# Patient Record
Sex: Female | Born: 1960 | Race: White | Hispanic: No | Smoking: Current every day smoker
Health system: Southern US, Community
[De-identification: ages and names within clinical notes are randomized; demographics above are authoritative.]

## PROBLEM LIST (undated history)

## (undated) DIAGNOSIS — R519 Headache, unspecified: Secondary | ICD-10-CM

## (undated) DIAGNOSIS — J449 Chronic obstructive pulmonary disease, unspecified: Secondary | ICD-10-CM

## (undated) DIAGNOSIS — F329 Major depressive disorder, single episode, unspecified: Secondary | ICD-10-CM

## (undated) DIAGNOSIS — F22 Delusional disorders: Secondary | ICD-10-CM

## (undated) DIAGNOSIS — J439 Emphysema, unspecified: Secondary | ICD-10-CM

## (undated) DIAGNOSIS — D649 Anemia, unspecified: Secondary | ICD-10-CM

## (undated) DIAGNOSIS — F419 Anxiety disorder, unspecified: Secondary | ICD-10-CM

## (undated) DIAGNOSIS — T50902A Poisoning by unspecified drugs, medicaments and biological substances, intentional self-harm, initial encounter: Secondary | ICD-10-CM

## (undated) DIAGNOSIS — F32A Depression, unspecified: Secondary | ICD-10-CM

## (undated) DIAGNOSIS — S0990XA Unspecified injury of head, initial encounter: Secondary | ICD-10-CM

## (undated) DIAGNOSIS — K219 Gastro-esophageal reflux disease without esophagitis: Secondary | ICD-10-CM

## (undated) DIAGNOSIS — M359 Systemic involvement of connective tissue, unspecified: Secondary | ICD-10-CM

## (undated) DIAGNOSIS — J45909 Unspecified asthma, uncomplicated: Secondary | ICD-10-CM

## (undated) DIAGNOSIS — J302 Other seasonal allergic rhinitis: Secondary | ICD-10-CM

## (undated) DIAGNOSIS — R51 Headache: Secondary | ICD-10-CM

## (undated) DIAGNOSIS — G4730 Sleep apnea, unspecified: Secondary | ICD-10-CM

## (undated) DIAGNOSIS — R197 Diarrhea, unspecified: Secondary | ICD-10-CM

## (undated) DIAGNOSIS — T7840XA Allergy, unspecified, initial encounter: Secondary | ICD-10-CM

## (undated) DIAGNOSIS — R7303 Prediabetes: Secondary | ICD-10-CM

## (undated) DIAGNOSIS — F99 Mental disorder, not otherwise specified: Secondary | ICD-10-CM

## (undated) DIAGNOSIS — M1990 Unspecified osteoarthritis, unspecified site: Secondary | ICD-10-CM

## (undated) HISTORY — DX: Allergy, unspecified, initial encounter: T78.40XA

## (undated) HISTORY — PX: TONSILLECTOMY: SUR1361

## (undated) HISTORY — PX: NASAL SEPTUM SURGERY: SHX37

## (undated) HISTORY — DX: Emphysema, unspecified: J43.9

---

## 2012-08-03 DIAGNOSIS — K219 Gastro-esophageal reflux disease without esophagitis: Secondary | ICD-10-CM | POA: Insufficient documentation

## 2012-08-03 DIAGNOSIS — J45909 Unspecified asthma, uncomplicated: Secondary | ICD-10-CM | POA: Insufficient documentation

## 2012-08-03 DIAGNOSIS — F603 Borderline personality disorder: Secondary | ICD-10-CM | POA: Insufficient documentation

## 2012-08-03 DIAGNOSIS — G4700 Insomnia, unspecified: Secondary | ICD-10-CM | POA: Insufficient documentation

## 2012-08-03 DIAGNOSIS — J449 Chronic obstructive pulmonary disease, unspecified: Secondary | ICD-10-CM | POA: Insufficient documentation

## 2012-08-03 DIAGNOSIS — F319 Bipolar disorder, unspecified: Secondary | ICD-10-CM | POA: Insufficient documentation

## 2012-08-03 DIAGNOSIS — J309 Allergic rhinitis, unspecified: Secondary | ICD-10-CM | POA: Insufficient documentation

## 2012-08-03 DIAGNOSIS — Z720 Tobacco use: Secondary | ICD-10-CM | POA: Insufficient documentation

## 2013-03-23 DIAGNOSIS — R87619 Unspecified abnormal cytological findings in specimens from cervix uteri: Secondary | ICD-10-CM | POA: Insufficient documentation

## 2015-05-31 ENCOUNTER — Ambulatory Visit
Admission: EM | Admit: 2015-05-31 | Discharge: 2015-05-31 | Disposition: A | Discharge status: Home / Self Care | Payer: Medicare Other | Attending: Family Medicine | Admitting: Family Medicine

## 2015-05-31 DIAGNOSIS — R51 Headache: Secondary | ICD-10-CM | POA: Diagnosis not present

## 2015-05-31 DIAGNOSIS — J449 Chronic obstructive pulmonary disease, unspecified: Secondary | ICD-10-CM | POA: Diagnosis not present

## 2015-05-31 DIAGNOSIS — Z9889 Other specified postprocedural states: Secondary | ICD-10-CM | POA: Diagnosis not present

## 2015-05-31 DIAGNOSIS — J3089 Other allergic rhinitis: Secondary | ICD-10-CM | POA: Diagnosis not present

## 2015-05-31 DIAGNOSIS — R519 Headache, unspecified: Secondary | ICD-10-CM

## 2015-05-31 DIAGNOSIS — F17210 Nicotine dependence, cigarettes, uncomplicated: Secondary | ICD-10-CM | POA: Insufficient documentation

## 2015-05-31 DIAGNOSIS — K219 Gastro-esophageal reflux disease without esophagitis: Secondary | ICD-10-CM

## 2015-05-31 DIAGNOSIS — IMO0001 Reserved for inherently not codable concepts without codable children: Secondary | ICD-10-CM

## 2015-05-31 DIAGNOSIS — F329 Major depressive disorder, single episode, unspecified: Secondary | ICD-10-CM | POA: Diagnosis not present

## 2015-05-31 DIAGNOSIS — R1013 Epigastric pain: Secondary | ICD-10-CM | POA: Diagnosis not present

## 2015-05-31 DIAGNOSIS — J45909 Unspecified asthma, uncomplicated: Secondary | ICD-10-CM | POA: Insufficient documentation

## 2015-05-31 DIAGNOSIS — Z79899 Other long term (current) drug therapy: Secondary | ICD-10-CM | POA: Diagnosis not present

## 2015-05-31 DIAGNOSIS — R197 Diarrhea, unspecified: Secondary | ICD-10-CM | POA: Diagnosis not present

## 2015-05-31 HISTORY — DX: Headache, unspecified: R51.9

## 2015-05-31 HISTORY — DX: Gastro-esophageal reflux disease without esophagitis: K21.9

## 2015-05-31 HISTORY — DX: Mental disorder, not otherwise specified: F99

## 2015-05-31 HISTORY — DX: Unspecified asthma, uncomplicated: J45.909

## 2015-05-31 HISTORY — DX: Headache: R51

## 2015-05-31 HISTORY — DX: Other seasonal allergic rhinitis: J30.2

## 2015-05-31 HISTORY — DX: Unspecified injury of head, initial encounter: S09.90XA

## 2015-05-31 HISTORY — DX: Chronic obstructive pulmonary disease, unspecified: J44.9

## 2015-05-31 HISTORY — DX: Major depressive disorder, single episode, unspecified: F32.9

## 2015-05-31 HISTORY — DX: Depression, unspecified: F32.A

## 2015-05-31 LAB — BASIC METABOLIC PANEL
Anion gap: 7 (ref 5–15)
BUN: 10 mg/dL (ref 6–20)
CHLORIDE: 105 mmol/L (ref 101–111)
CO2: 27 mmol/L (ref 22–32)
CREATININE: 0.82 mg/dL (ref 0.44–1.00)
Calcium: 8.9 mg/dL (ref 8.9–10.3)
GFR calc Af Amer: >60 mL/min (ref >60)
GFR calc non Af Amer: >60 mL/min (ref >60)
GLUCOSE: 93 mg/dL (ref 65–99)
Potassium: 4.2 mmol/L (ref 3.5–5.1)
Sodium: 139 mmol/L (ref 135–145)

## 2015-05-31 LAB — CBC WITH DIFFERENTIAL/PLATELET
Basophils Absolute: 0.1 10*3/uL (ref 0–0.1)
Basophils Relative: 1 %
Eosinophils Absolute: 0.3 10*3/uL (ref 0–0.7)
Eosinophils Relative: 3 %
HEMATOCRIT: 41 % (ref 35.0–47.0)
HEMOGLOBIN: 13.7 g/dL (ref 12.0–16.0)
LYMPHS ABS: 3.9 10*3/uL — AB (ref 1.0–3.6)
Lymphocytes Relative: 39 %
MCH: 29.9 pg (ref 26.0–34.0)
MCHC: 33.4 g/dL (ref 32.0–36.0)
MCV: 89.4 fL (ref 80.0–100.0)
MONO ABS: 0.6 10*3/uL (ref 0.2–0.9)
MONOS PCT: 6 %
NEUTROS ABS: 5.1 10*3/uL (ref 1.4–6.5)
NEUTROS PCT: 51 %
Platelets: 231 10*3/uL (ref 150–440)
RBC: 4.58 MIL/uL (ref 3.80–5.20)
RDW: 14 % (ref 11.5–14.5)
WBC: 10 10*3/uL (ref 3.6–11.0)

## 2015-05-31 MED ORDER — ALBUTEROL SULFATE HFA 108 (90 BASE) MCG/ACT IN AERS: 2.0000 | INHALATION_SPRAY | Freq: Four times a day (QID) | RESPIRATORY_TRACT | Status: AC | PRN

## 2015-05-31 MED ORDER — CETIRIZINE HCL 10 MG PO TABS: 10.0000 mg | ORAL_TABLET | Freq: Every day | ORAL | Status: DC

## 2015-05-31 MED ORDER — OMEPRAZOLE 20 MG PO CPDR: DELAYED_RELEASE_CAPSULE | ORAL | Status: AC

## 2015-05-31 NOTE — ED Provider Notes (Signed)
CSN: [id number]     Arrival date & time 05/31/15  1124 History   First MD Initiated Contact with Patient 05/31/15 1216     Chief Complaint  Patient presents with  . Diarrhea    Pt with multiple long-term medical complaints and no PCP. 2 months of Headaches, 6 weeks of Diarrhea, and pain to left lower eyelid. Pt out of some of her medications.    (Consider location/radiation/quality/duration/timing/severity/associated sxs/prior Treatment) HPI: Patient presents today stating that she would like to establish with a primary care physician. She states that her neuropsychiatrist as stated that she needs to establish with one to handle her chronic medical issues. She admits to having diarrhea for approximately 6 weeks. She denies any bloody diarrhea or recent travel. She denies any recent antibiotic use. She has mild epigastric pain at times that she relates to her acid reflux. She also has had mild intermittent generalized headaches for 2 months. No headache currently. She denies any weight loss, chest pain, shortness of breath, syncope, vision problems, vomiting, urinary symptoms, fever. She also asked that she get refills on her Zyrtec, Prilosec, Albuterol.  Past Medical History  Diagnosis Date  . Head injury   . Psychiatric disorder   . Frequent headaches   . Acid reflux   . Depression   . Seasonal allergic rhinitis   . Asthma   . COPD (chronic obstructive pulmonary disease) [facility])    Past Surgical History  Procedure Laterality Date  . Nasal septum surgery    . Tonsillectomy     History reviewed. No pertinent family history. Social History  Substance Use Topics  . Smoking status: Current Every Day Smoker -- 1.00 packs/day    Types: Cigarettes  . Smokeless tobacco: None  . Alcohol Use: No   OB History    No data available     Review of Systems: Negative except mentioned above.   Allergies  Seroquel  Home Medications   Prior to Admission medications   Medication Sig Start  Date End Date Taking? Authorizing Provider  albuterol (PROVENTIL HFA;VENTOLIN HFA) 108 (90 Base) MCG/ACT inhaler Inhale into the lungs every 6 (six) hours as needed for wheezing or shortness of breath.   Yes Historical Provider, MD  cetirizine (ZYRTEC) 10 MG tablet Take 10 mg by mouth daily.   Yes Historical Provider, MD  diazepam (VALIUM) 10 MG tablet Take 10 mg by mouth every 6 (six) hours as needed for anxiety (Take 3 pills at a time total of 30mg ).   Yes Historical Provider, MD  OLANZapine (ZYPREXA) 5 MG tablet Take 5 mg by mouth at bedtime.   Yes Historical Provider, MD  omeprazole (PRILOSEC) 40 MG capsule Take 40 mg by mouth daily.   Yes Historical Provider, MD   Meds Ordered and Administered this Visit  Medications - No data to display  BP 135/68 mmHg  Pulse 57  Temp(Src) 98.3 F (36.8 C) (Oral)  Resp 18  Ht 5\' 6"  (1.676 m)  Wt 132 lb (59.875 kg)  BMI 21.32 kg/m2  SpO2 100% No data found.   Physical Exam   GENERAL: NAD HEENT: no pharyngeal erythema, no exudate, no erythema of TMs, no cervical LAD RESP: CTA B CARD: RRR ABD: +BS, NT/ND, no rebound or guarding  NEURO: CN II-XII grossly intact   ED Course  Procedures (including critical care time)  Labs Review Labs Reviewed  CBC WITH DIFFERENTIAL/PLATELET  BASIC METABOLIC PANEL    Imaging Review No results found.  MDM  A/P: Multiple complaints including diarrhea and headaches- encourage patient to establish care with a primary care physician to further evaluate and treat. Given her symptoms I would recommend that she have stool studies done. I did do a CBC and BMP in the office today. Her vitals are stable and she can be discharged at this point establish care with a primary care physician in the near future. I have given her refills on her Prilosec, Zyrtec and Proventil for a few weeks. Patient will seek immediate medical attention if her symptoms do worsen as discussed.    Paulina Fusi, MD 05/31/15 1324

## 2015-06-04 ENCOUNTER — Ambulatory Visit: Payer: Self-pay | Admitting: Family Medicine

## 2015-06-07 ENCOUNTER — Encounter: Payer: Self-pay | Admitting: Gynecology

## 2015-06-07 ENCOUNTER — Ambulatory Visit
Admission: EM | Admit: 2015-06-07 | Discharge: 2015-06-07 | Disposition: A | Discharge status: Home / Self Care | Payer: Medicare Other | Attending: Family Medicine | Admitting: Family Medicine

## 2015-06-07 DIAGNOSIS — K0889 Other specified disorders of teeth and supporting structures: Secondary | ICD-10-CM | POA: Diagnosis not present

## 2015-06-07 DIAGNOSIS — K0500 Acute gingivitis, plaque induced: Secondary | ICD-10-CM

## 2015-06-07 MED ORDER — KETOROLAC TROMETHAMINE 60 MG/2ML IM SOLN
60.0000 mg | Freq: Once | INTRAMUSCULAR | Status: AC
  Administered 2015-06-07: 60 mg via INTRAMUSCULAR

## 2015-06-07 MED ORDER — AMOXICILLIN-POT CLAVULANATE 875-125 MG PO TABS: 1.0000 | ORAL_TABLET | Freq: Two times a day (BID) | ORAL | Status: DC

## 2015-06-07 MED ORDER — MELOXICAM 15 MG PO TABS: 15.0000 mg | ORAL_TABLET | Freq: Every day | ORAL | Status: DC

## 2015-06-07 NOTE — ED Notes (Signed)
Per patient stated tooth ache x 4 days ago at the right lower jaw. Per patient the front of her tooth start hurting x yesterday.  Per patient stated that she started a new medication on 05/24/15 Rx OlanZapine 5mg , 1 tab at night. Per patient the side effect of the medication is tooth pain.

## 2015-06-07 NOTE — Discharge Instructions (Signed)
Gingivitis  Gingivitis is an infection of the teeth and bones that support the teeth. Your gums become red, sore, and puffy (swollen). It is caused by germs that build up on your teeth and gums (plaque). HOME CARE  Floss and then brush your teeth.  Brush at least twice a day.  Floss at least once a day.  Avoid sugar between meals.  Do not drink juice before bed. Only drink water.  Make and keep your regular checkups and cleanings with your dentist.  Use any mouth care product or toothpaste as told by your dentist. GET HELP RIGHT AWAY IF:  You have painful, red tissue around your teeth.  You have trouble chewing.  You have loose or infected teeth. MAKE SURE YOU:  Understand these instructions.  Will watch your condition.  Will get help right away if you are not doing well or get worse.   This information is not intended to replace advice given to you by your health care provider. Make sure you discuss any questions you have with your health care provider.   Document Released: 03/06/2010 Document Revised: 04/26/2011 Document Reviewed: 09/16/2014 Elsevier Interactive Patient Education 2016 [place], Adult Preventive dental care includes seeing a dentist regularly and practicing good dental care (oral hygiene) at home. These actions can help to prevent cavities and other tooth problems, root canal problems, gum disease (gingivitis), and tooth loss. Regular dental exams may also help your health care provider to diagnose other medical problems. Many diseases, including mouth cancers, have early signs that can be found during a preventive dental care visit. WHAT CAN I EXPECT DURING MY DENTAL VISITS? Many adults see their dentist one or two times each year for oral exams and cleanings. Talk with your dentist about the best preventive dental care schedule for you. At your visit, your dentist may ask you about:  Your overall health and diet.  Any new  symptoms, such as:  Bleeding gums.  Mouth, tooth, or jaw pain. Your dentist will do a mouth (oral) exam to check for:  Cavities.  Gingivitis or other problems.  Signs of cancer.  Neck swelling or lumps.  Abnormal jaw movement or pain in the jaw joint. You may also have:  Dental X-rays.  Your teeth cleaned. If you have an early problem, like a cavity, your dentist will schedule time for you to get treatment. If you have a tooth root problem, gum disease, or a sign of another disease, your dentist may send you to see another health care provider for care. HOW CAN I CARE FOR MY TEETH AT HOME?  Brush with an approved fluoride toothpaste every morning and night. If possible, brush within 10 minutes after every meal.  Floss one time every day.  Periodically check your teeth for white or brown spots after brushing. These may be signs of cavities.  Check your gums for swelling or bleeding. These may be signs of gum disease, such as gingivitis or periodontitis.  Make sure your diet includes plenty of fruits, vegetables, milk and dairy products, whole grains, and proteins. Do not eat a lot of starchy foods or foods with added sugar. Talk with your health care provider if you have questions about following a healthy diet.  Avoid sodas, sugary snacks, and sticky candies.  Do not smoke.  Do not get mouth piercings.  If you have tooth or gum pain, gargle with a salt-water mixture 3-4 times per day or as needed. To make a  salt-water mixture, completely dissolve -1 tsp of salt in 1 cup of warm water.  Take over-the counter and prescription medicines only as told by your dentist.  If you have a permanent tooth knocked out:  Find the tooth.  Pick it up by the top (crown) with a tissue or gauze.  Wash the tooth for no more than 10 seconds under cold, running water.  Try to put the tooth back into the gum socket.  Put the tooth in a glass of milk if you cannot get it back in  place.  Go to your dentist right away. Take the tooth with you. WHEN SHOULD I SEEK MEDICAL CARE? Call your dentist if you have:  Gum, tooth, or jaw pain.  Red, swollen, or bleeding gums.  A tooth or teeth that are very sensitive to hot or cold.  Very bad breath.  A problem with a filling, crown, implant, or denture.  A broken or loose tooth.  A growth or sore in your mouth that is not going away. FOR MORE INFORMATION American Dental Association: http://clayton-rivera.info/    This information is not intended to replace advice given to you by your health care provider. Make sure you discuss any questions you have with your health care provider.   Document Released: 10/23/2014 Document Reviewed: 07/16/2014 Elsevier Interactive Patient Education 2016 [place] [address] is a great source of information about community services available.  Access by dialing 2-1-1 from anywhere in [place], or by website -  CustodianSupply.fi.   Other Local Resources (Updated 02/2015)  Dental  Care   Services    Phone Number and Address  Cost  [facility] For children 68 - 44 years of age:   Cleaning  Tooth brushing/flossing instruction  Sealants, fillings, crowns  Extractions  Emergency treatment  [phone] [address], [place] [address] Charges based on family income.  Medicaid and some insurance plans accepted.     [facility] - [facility], fillings, crowns  Extractions  Emergency treatment [phone], [place]  Pregnant women 64 years of age or older with a Medicaid card  [facility] - [place]  Cleaning  Sealants, fillings, crowns  Extractions  Emergency treatment [phone] [address] [place], [place] Pregnant women 61 years of age or older with a Medicaid card   [facility] For children 46 - 64 years of age:   Cleaning  Tooth brushing/flossing instruction  Sealants, fillings, crowns  Extractions  Emergency treatment Limited orthodontic services for patients with Medicaid [phone] [address],  [address] Medicaid and [facility] Health Choice cover for children up to age 11 and pregnant women.  Parents of children up to age 34 without Medicaid pay a reduced fee at time of service.  [place] For children 36 - 82 years of age:   Cleaning  Tooth brushing/flossing instruction  Sealants, fillings, crowns  Extractions  Emergency treatment Limited orthodontic services for patients with Medicaid [phone] [place], [place].  Medicaid and  Health Choice cover for children up to age 11 and pregnant women.  Parents of children up to age 72 without Medicaid pay a reduced fee.  [facility] [employer], fillings, crowns  Extractions  Hours: Tuesdays and Thursdays, 4:15 - 8 pm [phone] [address], [place], [place] [address] Services free of charge to [facility] residents ages 18-64 who do not have health insurance, Medicare, [place], or [place] benefits and fall within federal poverty guidelines  [place] care in addition to primary medical care, nutritional counseling, and pharmacy:  Engineer, drilling, fillings, crowns  Extractions                  [phone] [facility], [place], [place], [place], [place], [facility], [place], [place] [facility], [place], [place], most insurance.  Also provides services available to all with fees adjusted based on ability to pay.    [facility]  Cleaning  Tooth brushing/flossing instruction  Sealants, fillings, crowns  Extractions  Emergency treatment Hours: Tuesdays, Thursdays, and Fridays from 8 am to 5 pm by appointment only. [phone] [place], [place] [address] [facility] residents with Medicaid (depending on eligibility) and children with [facility] Health Choice - call for more information.  [facility]  Extractions only  Hours: 2nd and 4th Thursday of each month from 6:30 am - 9 am.   [phone] ext. [address] [place], [place] [address] Ages 22 and older only.  Patients are seen on a first come, first served basis.  [employer]  [employer]  Extractions  Orthodontics  Endodontics  Implants/Crowns/Bridges  Complete and partial dentures [phone] [place], [place] Patients must complete an application for services.  There is often a waiting list.

## 2015-06-07 NOTE — ED Provider Notes (Signed)
CSN: [id number]     Arrival date & time 06/07/15  [id number] History   First MD Initiated Contact with Patient 06/07/15 [phone]    Nurses notes were reviewed. Chief Complaint  Patient presents with  . Dental Pain  Patient is an unusual history 55 year old white female who informs me when I walked the room that she has mental health issues and challenges. She states that she is having pain in the right lower gum for several days now. She's been taking Motrin she can take Aleve but she can't take Naprosyn. She's not had good relief of pain but since 2:00 this morning she's been up most the morning complaining of pain in the lower mouth as well. The pain is now spread from the right lower him to the mid lower mouth as well. She has a very unusual and she difficulty history of taking care of his significant other with her Algis Downs brain and orthopedic problems who has subsequently gone back to his home in [place]. She does establish itself was establishing herself with Dr. Vicente Masson as she seeing a neuropsychiatrist as well. She is currently taking Zyprexa which she states is been wonderful in controlling her mood swings, anger and frustration. She saw where Zyprexa could cause hent pain and she was worried about that. She does admit to having a history of face planting about a year ago when she was taking care of her significant other requiring dental surgery but because of all the other things occurring in her life she opted not to do that so she has several teeth are loose and she does smoke at least a pack cigarettes a day. She does states she needs to see a dentist explained to her with her need to see a dentist and other dental issues I don't think this apraxia is the cause of her problems at this time.  Multiple medical problems mostly psychiatric as well as head injury history and frequent headaches asthma and COPD. 7 nasal septum surgery and tonsillectomy. No significant family history pertinent to today's visit. She  denies any fever.    (Consider location/radiation/quality/duration/timing/severity/associated sxs/prior Treatment) Patient is a 55 y.o. female presenting with tooth pain. The history is provided by the patient. No language interpreter was used.  Dental Pain Location:  Lower Quality:  Aching and pressure-like Severity:  Moderate Onset quality:  Gradual Progression:  Worsening Chronicity:  New Context: dental caries, poor dentition and trauma   Relieved by:  Nothing Ineffective treatments:  NSAIDs Associated symptoms: facial pain and gum swelling   Risk factors: lack of dental care, periodontal disease and smoking     Past Medical History  Diagnosis Date  . Head injury   . Psychiatric disorder   . Frequent headaches   . Acid reflux   . Depression   . Seasonal allergic rhinitis   . Asthma   . COPD (chronic obstructive pulmonary disease) [facility])    Past Surgical History  Procedure Laterality Date  . Nasal septum surgery    . Tonsillectomy     No family history on file. Social History  Substance Use Topics  . Smoking status: Current Every Day Smoker -- 1.00 packs/day    Types: Cigarettes  . Smokeless tobacco: None  . Alcohol Use: No   OB History    No data available     Review of Systems  Psychiatric/Behavioral: Positive for sleep disturbance and dysphoric mood. The patient is nervous/anxious.     Allergies  Seroquel  Home Medications  Prior to Admission medications   Medication Sig Start Date End Date Taking? Authorizing Provider  albuterol (PROVENTIL HFA;VENTOLIN HFA) 108 (90 Base) MCG/ACT inhaler Inhale 2 puffs into the lungs every 6 (six) hours as needed for wheezing or shortness of breath. 05/31/15  Yes Paulina Fusi, MD  cetirizine (ZYRTEC) 10 MG tablet Take 1 tablet (10 mg total) by mouth daily. 05/31/15  Yes Paulina Fusi, MD  diazepam (VALIUM) 10 MG tablet Take 10 mg by mouth every 6 (six) hours as needed for anxiety (Take 3 pills at a time total of  30mg ).   Yes Historical Provider, MD  OLANZapine (ZYPREXA) 5 MG tablet Take 5 mg by mouth at bedtime.   Yes Historical Provider, MD  omeprazole (PRILOSEC) 20 MG capsule Take one capsule once daily. 05/31/15  Yes Paulina Fusi, MD  amoxicillin-clavulanate (AUGMENTIN) 875-125 MG tablet Take 1 tablet by mouth 2 (two) times daily. 06/07/15   Frederich Cha, MD  meloxicam (MOBIC) 15 MG tablet Take 1 tablet (15 mg total) by mouth daily. 06/07/15   Frederich Cha, MD   Meds Ordered and Administered this Visit   Medications  ketorolac (TORADOL) injection 60 mg (not administered)    BP 122/60 mmHg  Pulse 72  Temp(Src) 98.3 F (36.8 C) (Oral)  Resp 16  Ht 5\' 6"  (1.676 m)  Wt 135 lb (61.236 kg)  BMI 21.80 kg/m2  SpO2 99% No data found.   Physical Exam  Constitutional: She is oriented to person, place, and time. She appears well-developed and well-nourished.  HENT:  Head: Normocephalic and atraumatic.  Mouth/Throat: Uvula is midline. Dental caries present. No uvula swelling.  Gingiva tenderness and swelling present  Eyes: Pupils are equal, round, and reactive to light.  Neck: Normal range of motion.  Musculoskeletal: Normal range of motion.  Neurological: She is alert and oriented to person, place, and time.  Vitals reviewed.   ED Course  Procedures (including critical care time)  Labs Review Labs Reviewed - No data to display  Imaging Review No results found.   Visual Acuity Review  Right Eye Distance:   Left Eye Distance:   Bilateral Distance:    Right Eye Near:   Left Eye Near:    Bilateral Near:         MDM   1. Gingivitis, acute   2. Pain, dental    [place] 60 mg Toradol IM for pain. Placed on Mobic 15 mg Augmentin 875 one tablet twice a day information of local dentistry and dental clinics given to patient as well as scripts sent to the drugstore of her choice. Follow-up with dentist next week.    Frederich Cha, MD 06/07/15 1036

## 2015-06-12 ENCOUNTER — Encounter: Payer: Self-pay | Admitting: Family Medicine

## 2015-06-12 ENCOUNTER — Ambulatory Visit (INDEPENDENT_AMBULATORY_CARE_PROVIDER_SITE_OTHER): Payer: Medicare Other | Admitting: Family Medicine

## 2015-06-12 VITALS — BP 130/100 | HR 72 | Ht 66.0 in | Wt 147.0 lb

## 2015-06-12 DIAGNOSIS — R339 Retention of urine, unspecified: Secondary | ICD-10-CM | POA: Diagnosis not present

## 2015-06-12 DIAGNOSIS — K047 Periapical abscess without sinus: Secondary | ICD-10-CM | POA: Diagnosis not present

## 2015-06-12 DIAGNOSIS — J309 Allergic rhinitis, unspecified: Secondary | ICD-10-CM

## 2015-06-12 DIAGNOSIS — F3170 Bipolar disorder, currently in remission, most recent episode unspecified: Secondary | ICD-10-CM | POA: Diagnosis not present

## 2015-06-12 DIAGNOSIS — Z720 Tobacco use: Secondary | ICD-10-CM | POA: Diagnosis not present

## 2015-06-12 DIAGNOSIS — K589 Irritable bowel syndrome without diarrhea: Secondary | ICD-10-CM

## 2015-06-12 DIAGNOSIS — J449 Chronic obstructive pulmonary disease, unspecified: Secondary | ICD-10-CM

## 2015-06-12 DIAGNOSIS — F17200 Nicotine dependence, unspecified, uncomplicated: Secondary | ICD-10-CM | POA: Insufficient documentation

## 2015-06-12 MED ORDER — CETIRIZINE HCL 10 MG PO TABS
10.0000 mg | ORAL_TABLET | Freq: Every day | ORAL | Status: AC
Start: 2015-06-12 — End: ?

## 2015-06-12 MED ORDER — LOPERAMIDE HCL 2 MG PO TABS: ORAL_TABLET | ORAL | Status: DC

## 2015-06-12 MED ORDER — TAMSULOSIN HCL 0.4 MG PO CAPS: 0.4000 mg | ORAL_CAPSULE | Freq: Every day | ORAL | Status: DC

## 2015-06-12 MED ORDER — TIOTROPIUM BROMIDE MONOHYDRATE 18 MCG IN CAPS: 18.0000 ug | ORAL_CAPSULE | Freq: Every day | RESPIRATORY_TRACT | Status: AC

## 2015-06-12 NOTE — Progress Notes (Signed)
Date:  06/12/2015   Name:  Edis Drach   DOB:  1961/02/09   MRN:  [id number]  PCP:  Adline Potter, MD    Chief Complaint: Establish Care and COPD   History of Present Illness:  This is a 55 y.o. female seen for initial visit, has been getting her primary care at [facility]. Hx bipolar d/o and borderline PD followed by psych on Zyprexa and Valium, which she takes prn. C/o SOB, dx'd with COPD in past, using albuterol inhaler multiple times a day every day and not helping anymore, does not want to talk about smoking cessation given the stress she's under. Never used other inhalers and won't take steroids because they make her mean. C/o chronic diarrhea x 6 years, was seen for this at [facility] two weeks ago, BMP/CBC normal. Stools occ black but no BRB. Has not tried any antidiarrheal including Imodium. Also seen [place] last week for dental infection and placed on Augmentin and Mobic, sxs improved, has dental appt next week. Needs refill cetrizine which she must take for her allergies. C/o urinary retention (has to push on bladder to empty) and stress incontinence when coughs or sneezes, wears Depends regularly. Had urology eval done at [facility] 20 yrs ago and told had a bladder like a man. One medicine she tried made her not urinate at all.   Review of Systems:  Review of Systems  Constitutional: Negative for fever and fatigue.  Respiratory: Negative for cough.   Cardiovascular: Negative for chest pain and leg swelling.  Gastrointestinal: Negative for nausea, vomiting and blood in stool.  Genitourinary: Negative for hematuria.  Neurological: Negative for light-headedness.    Patient Active Problem List   Diagnosis Date Noted  . IBS (irritable bowel syndrome) 06/12/2015  . Urinary retention 06/12/2015  . Smoker 06/12/2015  . Abnormal Pap smear of cervix 03/23/2013  . Allergic rhinitis 08/03/2012  . Airway hyperreactivity 08/03/2012  . Bipolar affective disorder ([place]) 08/03/2012  . Borderline personality  disorder 08/03/2012  . Chronic obstructive pulmonary disease ([place]) 08/03/2012  . Acid reflux 08/03/2012  . Cannot sleep 08/03/2012  . Current tobacco use 08/03/2012    Prior to Admission medications   Medication Sig Start Date End Date Taking? Authorizing Provider  albuterol (PROVENTIL HFA;VENTOLIN HFA) 108 (90 Base) MCG/ACT inhaler Inhale 2 puffs into the lungs every 6 (six) hours as needed for wheezing or shortness of breath. 05/31/15  Yes Paulina Fusi, MD  amoxicillin-clavulanate (AUGMENTIN) 875-125 MG tablet Take 1 tablet by mouth 2 (two) times daily. 06/07/15  Yes Frederich Cha, MD  cetirizine (ZYRTEC) 10 MG tablet Take 1 tablet (10 mg total) by mouth daily. 06/12/15  Yes Adline Potter, MD  diazepam (VALIUM) 10 MG tablet Take 10 mg by mouth every 6 (six) hours as needed for anxiety (Take 3 pills at a time total of 30mg ). psych   Yes Historical Provider, MD  meloxicam (MOBIC) 15 MG tablet Take 1 tablet (15 mg total) by mouth daily. 06/07/15  Yes Frederich Cha, MD  OLANZapine (ZYPREXA) 5 MG tablet Take 5 mg by mouth at bedtime.   Yes Historical Provider, MD  omeprazole (PRILOSEC) 20 MG capsule Take one capsule once daily. Patient taking differently: Take 40 mg by mouth daily. Take one capsule once daily. 05/31/15  Yes Paulina Fusi, MD  loperamide (IMODIUM A-D) 2 MG tablet Take one tablet after each bowel movement up to 5 tablets daily 06/12/15   Adline Potter, MD  tamsulosin (FLOMAX) 0.4 MG CAPS capsule Take 1 capsule (  0.4 mg total) by mouth daily. 06/12/15   Adline Potter, MD  tiotropium (SPIRIVA HANDIHALER) 18 MCG inhalation capsule Place 1 capsule (18 mcg total) into inhaler and inhale daily. 06/12/15   Adline Potter, MD    Allergies  Allergen Reactions  . Quetiapine Anaphylaxis  . Seroquel [Quetiapine Fumarate] Other (See Comments)    Mulitple psychiatric drug reactions, but unable to list  . Imipramine Other (See Comments)    Gas pains    Past Surgical History  Procedure Laterality Date   . Nasal septum surgery    . Tonsillectomy      Social History  Substance Use Topics  . Smoking status: Current Every Day Smoker -- 1.00 packs/day    Types: Cigarettes  . Smokeless tobacco: None  . Alcohol Use: No    History reviewed. No pertinent family history.  Medication list has been reviewed and updated.  Physical Examination: BP 130/100 mmHg  Pulse 72  Ht 5\' 6"  (1.676 m)  Wt 147 lb (66.679 kg)  BMI 23.74 kg/m2  SpO2 98%  Physical Exam  Constitutional: She appears well-developed and well-nourished.  Cardiovascular: Normal rate, regular rhythm and normal heart sounds.   Pulmonary/Chest: Effort normal and breath sounds normal.  Abdominal: Soft. She exhibits no distension. There is no tenderness.  Musculoskeletal: She exhibits no edema.  Neurological: She is alert.  Skin: Skin is warm and dry.  Psychiatric: She has a normal mood and affect. Her behavior is normal.  Nursing note and vitals reviewed.   Assessment and Plan:  1. Chronic obstructive pulmonary disease, unspecified COPD type (HCC) Trial Spiriva daily, cont albuterol prn only  2. IBS (irritable bowel syndrome) Trial Imodium one tab after each loose BM, max 5/d  3. Allergic rhinitis, unspecified allergic rhinitis type Refill cetrizine, doubt contributing to urinary retention  4. Urinary retention Trial Flomax qhs though may make stress incontinence worse  5. Dental infection Complete course Augmentin/Mobic and f/u with dentist as scheduled  6. Smoker Pt declined to discuss  7. Bipolar d/o On Zyprexa/Valium, followed by psych  Return in about 4 weeks (around 07/10/2015).  Satira Anis. [facility]  06/12/2015

## 2015-06-19 ENCOUNTER — Ambulatory Visit: Payer: Self-pay | Admitting: Family Medicine

## 2015-06-26 DIAGNOSIS — F3160 Bipolar disorder, current episode mixed, unspecified: Secondary | ICD-10-CM | POA: Insufficient documentation

## 2015-07-10 ENCOUNTER — Ambulatory Visit: Payer: Medicare Other | Admitting: Family Medicine

## 2015-07-26 DIAGNOSIS — N3281 Overactive bladder: Secondary | ICD-10-CM | POA: Insufficient documentation

## 2015-11-09 ENCOUNTER — Ambulatory Visit
Admission: EM | Admit: 2015-11-09 | Discharge: 2015-11-09 | Disposition: A | Discharge status: Home / Self Care | Payer: Medicare Other | Attending: Family Medicine | Admitting: Family Medicine

## 2015-11-09 ENCOUNTER — Emergency Department
Admission: EM | Admit: 2015-11-09 | Discharge: 2015-11-10 | Disposition: A | Discharge status: Home / Self Care | Payer: Medicare Other | Source: Home / Self Care

## 2015-11-09 ENCOUNTER — Encounter: Payer: Self-pay | Admitting: Gynecology

## 2015-11-09 ENCOUNTER — Encounter: Payer: Self-pay | Admitting: Emergency Medicine

## 2015-11-09 DIAGNOSIS — T7421XA Adult sexual abuse, confirmed, initial encounter: Secondary | ICD-10-CM | POA: Diagnosis not present

## 2015-11-09 DIAGNOSIS — K219 Gastro-esophageal reflux disease without esophagitis: Secondary | ICD-10-CM | POA: Diagnosis not present

## 2015-11-09 DIAGNOSIS — J449 Chronic obstructive pulmonary disease, unspecified: Secondary | ICD-10-CM | POA: Diagnosis not present

## 2015-11-09 DIAGNOSIS — F17210 Nicotine dependence, cigarettes, uncomplicated: Secondary | ICD-10-CM | POA: Diagnosis not present

## 2015-11-09 HISTORY — DX: Delusional disorders: F22

## 2015-11-09 HISTORY — DX: Poisoning by unspecified drugs, medicaments and biological substances, intentional self-harm, initial encounter: T50.902A

## 2015-11-09 LAB — URINALYSIS COMPLETE WITH MICROSCOPIC (ARMC ONLY)
BILIRUBIN URINE: NEGATIVE
Bacteria, UA: NONE SEEN
Glucose, UA: NEGATIVE mg/dL
KETONES UR: NEGATIVE mg/dL
LEUKOCYTES UA: NEGATIVE
NITRITE: NEGATIVE
PH: 6 (ref 5.0–8.0)
Protein, ur: NEGATIVE mg/dL
SPECIFIC GRAVITY, URINE: 1.015 (ref 1.005–1.030)
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
## 2015-11-09 NOTE — Discharge Instructions (Signed)
Go directly to emergency room as discussed.  °

## 2015-11-09 NOTE — ED Triage Notes (Signed)
[facility] seen at [facility] for [facility]. Pt states was seen by a doctor and is angry that she has to wait and be seen again. Sane nurse is aware she is here.

## 2016-04-07 ENCOUNTER — Ambulatory Visit
Admission: RE | Admit: 2016-04-07 | Discharge: 2016-04-07 | Disposition: A | Discharge status: Home / Self Care | Payer: Medicare Other | Source: Ambulatory Visit | Attending: Family Medicine | Admitting: Family Medicine

## 2016-04-07 ENCOUNTER — Ambulatory Visit
Admission: EM | Admit: 2016-04-07 | Discharge: 2016-04-07 | Disposition: A | Discharge status: Home / Self Care | Payer: Medicare Other | Attending: Family Medicine | Admitting: Family Medicine

## 2016-04-07 DIAGNOSIS — N644 Mastodynia: Secondary | ICD-10-CM

## 2016-04-07 DIAGNOSIS — M7989 Other specified soft tissue disorders: Secondary | ICD-10-CM | POA: Diagnosis not present

## 2016-04-07 DIAGNOSIS — M79622 Pain in left upper arm: Secondary | ICD-10-CM | POA: Diagnosis not present

## 2016-04-07 DIAGNOSIS — N6320 Unspecified lump in the left breast, unspecified quadrant: Secondary | ICD-10-CM

## 2016-04-07 MED ORDER — CEFUROXIME AXETIL 500 MG PO TABS: 500.0000 mg | ORAL_TABLET | Freq: Two times a day (BID) | ORAL | 0 refills | Status: DC

## 2016-04-07 MED ORDER — KETOROLAC TROMETHAMINE 60 MG/2ML IM SOLN
60.0000 mg | Freq: Once | INTRAMUSCULAR | Status: AC
  Administered 2016-04-07: 60 mg via INTRAMUSCULAR

## 2016-04-07 MED ORDER — TRAMADOL HCL 50 MG PO TABS: 50.0000 mg | ORAL_TABLET | Freq: Four times a day (QID) | ORAL | 0 refills | Status: DC | PRN

## 2016-04-07 NOTE — ED Triage Notes (Signed)
Patient complains of left arm pain that started 2 months ago. Patient reports that she donates plasma twice a week and they always use the left arm. Patient states that she is also having left breast pain that radiates through her nipple. Patient states that breast pain started one month and recently got significantly worse.

## 2016-04-07 NOTE — ED Provider Notes (Addendum)
[facility]    CSN: [id number] Arrival date & time: 04/07/16  0915     History   Chief Complaint Chief Complaint  Patient presents with  . Arm Pain  . Breast Pain    HPI Carla Conley is a 56 y.o. female.   Patient's here because of left arm pain and breast pain. She states that she started giving plasma last several months. She had to get up to 8 transfusions in a month's time to bonus which was trying to obtain. Report having left arm pain that started about 2 months ago. She reports left arm pain has gotten worse she stopped them blood or plasma. She also reports not having pain in her left nipple left breast and the pain is increasingly gotten worse. She is never has had a mammogram. Reports no fever reports most the pain in the left breast is over the nipple and over the left lateral and upper part of the breast as well. Left breast as was the larger than the right. No pertinent family medical history relevant to today's she states that the only family members with cancer or skin cancer in his history heart disease in the family. visit however patient does smoke. She has multiple medical problems including depression COPD asthma acid reflux paranoid disorders and states seasonal allergies and suicide overdose in the past. She has had skin cancer's office will be for the past. She reports left arm hurting her so bad she can't use it to do her daily hygiene and she is getting concerned because increased pain. She has appointment with PCP sometimes early in March but felt she needed to be seen and evaluated before then. She comes in with her support sitter.   The history is provided by the patient (support sitter). No language interpreter was used.  Arm Pain  This is a new problem. The current episode started more than 1 week ago. The problem occurs constantly. The problem has been gradually worsening. Pertinent negatives include no chest pain, no abdominal pain, no headaches  and no shortness of breath. The symptoms are aggravated by twisting. Nothing relieves the symptoms. She has tried nothing for the symptoms. The treatment provided no relief.    Past Medical History:  Diagnosis Date  . Acid reflux   . Asthma   . COPD (chronic obstructive pulmonary disease) ([place])   . Depression   . Frequent headaches   . Head injury   . Paranoid disorder ([place])   . Psychiatric disorder   . Seasonal allergic rhinitis   . Suicidal overdose [facility])     Patient Active Problem List   Diagnosis Date Noted  . IBS (irritable bowel syndrome) 06/12/2015  . Urinary retention 06/12/2015  . Smoker 06/12/2015  . Abnormal Pap smear of cervix 03/23/2013  . Allergic rhinitis 08/03/2012  . Airway hyperreactivity 08/03/2012  . Bipolar affective disorder ([place]) 08/03/2012  . Borderline personality disorder 08/03/2012  . Chronic obstructive pulmonary disease ([place]) 08/03/2012  . Acid reflux 08/03/2012    Past Surgical History:  Procedure Laterality Date  . NASAL SEPTUM SURGERY    . TONSILLECTOMY      OB History    No data available       Home Medications    Prior to Admission medications   Medication Sig Start Date End Date Taking? Authorizing Provider  albuterol (PROVENTIL HFA;VENTOLIN HFA) 108 (90 Base) MCG/ACT inhaler Inhale 2 puffs into the lungs every 6 (six) hours as needed for wheezing or  shortness of breath. 05/31/15  Yes Paulina Fusi, MD  cetirizine (ZYRTEC) 10 MG tablet Take 1 tablet (10 mg total) by mouth daily. 06/12/15  Yes Adline Potter, MD  diazepam (VALIUM) 10 MG tablet Take 10 mg by mouth every 6 (six) hours as needed for anxiety (Take 3 pills at a time total of 30mg ). psych   Yes Historical Provider, MD  omeprazole (PRILOSEC) 20 MG capsule Take one capsule once daily. Patient taking differently: Take 40 mg by mouth daily. Take one capsule once daily. 05/31/15  Yes Paulina Fusi, MD  tiotropium (SPIRIVA HANDIHALER) 18 MCG inhalation capsule Place 1 capsule (18  mcg total) into inhaler and inhale daily. 06/12/15  Yes Adline Potter, MD  tiZANidine (ZANAFLEX) 2 MG tablet Take by mouth every 6 (six) hours as needed for muscle spasms.   Yes Historical Provider, MD  amoxicillin-clavulanate (AUGMENTIN) 875-125 MG tablet Take 1 tablet by mouth 2 (two) times daily. 06/07/15   Frederich Cha, MD  cefUROXime (CEFTIN) 500 MG tablet Take 1 tablet (500 mg total) by mouth 2 (two) times daily. 04/07/16   Frederich Cha, MD  Chlorpheniramine-Phenylephrine (SUDAFED PE SINUS/ALLERGY PO) Take by mouth.    Historical Provider, MD  loperamide (IMODIUM A-D) 2 MG tablet Take one tablet after each bowel movement up to 5 tablets daily 06/12/15   Adline Potter, MD  meloxicam (MOBIC) 15 MG tablet Take 1 tablet (15 mg total) by mouth daily. 06/07/15   Frederich Cha, MD  naproxen sodium (ANAPROX) 220 MG tablet Take 220 mg by mouth 2 (two) times daily as needed.    Historical Provider, MD  OLANZapine (ZYPREXA) 5 MG tablet Take 5 mg by mouth at bedtime.    Historical Provider, MD  tamsulosin (FLOMAX) 0.4 MG CAPS capsule Take 1 capsule (0.4 mg total) by mouth daily. 06/12/15   Adline Potter, MD  traMADol (ULTRAM) 50 MG tablet Take 1 tablet (50 mg total) by mouth every 6 (six) hours as needed. 04/07/16   Frederich Cha, MD    Family History History reviewed. No pertinent family history.  Social History Social History  Substance Use Topics  . Smoking status: Current Every Day Smoker    Packs/day: 0.50    Types: Cigarettes  . Smokeless tobacco: Never Used  . Alcohol use 1.2 oz/week    2 Glasses of wine per week     Allergies   Quetiapine; Mobic [meloxicam]; Seroquel [quetiapine fumarate]; Zyprexa [olanzapine]; and Imipramine   Review of Systems Review of Systems  Respiratory: Negative for shortness of breath.   Cardiovascular: Negative for chest pain.  Gastrointestinal: Negative for abdominal pain.  Musculoskeletal: Positive for myalgias.  Neurological: Negative for headaches.  All other  systems reviewed and are negative.    Physical Exam Triage Vital Signs ED Triage Vitals  Enc Vitals Group     BP 04/07/16 0932 (!) 152/72     Pulse Rate 04/07/16 0932 84     Resp 04/07/16 0932 17     Temp 04/07/16 0932 98.7 F (37.1 C)     Temp Source 04/07/16 0932 Oral     SpO2 04/07/16 0932 99 %     Weight 04/07/16 0935 190 lb (86.2 kg)     Height 04/07/16 0935 5\' 6"  (1.676 m)     Head Circumference --      Peak Flow --      Pain Score 04/07/16 0938 10     Pain Loc --      Pain Edu? --  Excl. in [place]? --    No data found.   Updated Vital Signs BP (!) 152/72 (BP Location: Left Arm)   Pulse 84   Temp 98.7 F (37.1 C) (Oral)   Resp 17   Ht 5\' 6"  (1.676 m)   Wt 190 lb (86.2 kg)   SpO2 99%   BMI 30.67 kg/m   Visual Acuity Right Eye Distance:   Left Eye Distance:   Bilateral Distance:    Right Eye Near:   Left Eye Near:    Bilateral Near:     Physical Exam  Constitutional: She is oriented to person, place, and time. She appears well-developed and well-nourished.  HENT:  Head: Normocephalic and atraumatic.  Right Ear: External ear normal.  Eyes: Pupils are equal, round, and reactive to light.  Neck: Normal range of motion.  Pulmonary/Chest: Effort normal. Right breast exhibits no inverted nipple, no mass, no nipple discharge and no skin change. Left breast exhibits mass and tenderness. Left breast exhibits no inverted nipple, no nipple discharge and no skin change.  Shouldn't left breast is markedly larger than the right she has some diffuse tenderness as well but mostly tenderness in the right upper quadrant nipple is very tender to palpation and there is discrete mass/lumpiness in the left breast superior to the nipple as well.  Musculoskeletal: She exhibits tenderness.       Right shoulder: She exhibits tenderness, swelling and pain.       Arms: Over the left upper arm this swelling present she's got 2 different scars were she's had biopsies 1 for a skin  cancer 1 for benign lesion. The left arm also is tender to palpation as well.  Neurological: She is alert and oriented to person, place, and time.  Skin: Skin is warm.  Psychiatric: She has a normal mood and affect.  Vitals reviewed.    UC Treatments / Results  Labs (all labs ordered are listed, but only abnormal results are displayed) Labs Reviewed - No data to display  EKG  EKG Interpretation None       Radiology No results found.  Procedures Procedures (including critical care time)  Medications Ordered in UC Medications  ketorolac (TORADOL) injection 60 mg (60 mg Intramuscular Given 04/07/16 1032)     Initial Impression / Assessment and Plan / UC Course  I have reviewed the triage vital signs and the nursing notes.  Pertinent labs & imaging results that were available during my care of the patient were reviewed by me and considered in my medical decision making (see chart for details).   we have a 56 year old whose never had a mammogram before with difference in the left breast compared to the right and also of swollen left arm as well. 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 That's much more far-fetched but will place on Ceftin 500 mg 1 tablet twice a day for 10 days order a Doppler study venous of the left upper arm and ultrasounds of the breast and mammogram of the left breast as well. Follow-up PCP in about 2 weeks as scheduled but may need Referral to other specialists test positive. Should be noted the patient is unable to take almost all NSAIDs because of GI upset since even mobile tears stomach up. She is  pending upper endoscopy the very near future and colonoscopy as well. Because of that we going to try her on the lower pain medicine she does not  have an allergy to tramadol will give a shot of Toradol which will affect her GI problem or issue and will go place and tramadol 1 tablet 3 times a day #15.   Patient was looked up at the [facility] drug reporting site. She is gotten the prescriptions of Valium but no signs of opioid prescriptions recently.  Final Clinical Impressions(s) / UC Diagnoses   Final diagnoses:  Painful lumpy left breast  Left upper arm pain  Breast pain in female  Breast pain in female  Breast pain in female    New Prescriptions New Prescriptions   CEFUROXIME (CEFTIN) 500 MG TABLET    Take 1 tablet (500 mg total) by mouth 2 (two) times daily.   TRAMADOL (ULTRAM) 50 MG TABLET    Take 1 tablet (50 mg total) by mouth every 6 (six) hours as needed.     Note: This dictation was prepared with Dragon dictation along with smaller phrase technology. Any transcriptional errors that result from this process are unintentional.   Frederich Cha, MD 04/07/16 [place], MD 04/07/16 [phone]

## 2016-04-09 ENCOUNTER — Ambulatory Visit: Payer: Medicare Other

## 2016-04-09 ENCOUNTER — Other Ambulatory Visit: Payer: Medicare Other

## 2016-04-16 ENCOUNTER — Other Ambulatory Visit: Payer: Medicare Other

## 2016-04-16 ENCOUNTER — Ambulatory Visit: Payer: Medicare Other

## 2016-05-04 ENCOUNTER — Ambulatory Visit
Admission: RE | Admit: 2016-05-04 | Discharge: 2016-05-04 | Disposition: A | Discharge status: Home / Self Care | Payer: Medicare Other | Source: Ambulatory Visit | Attending: Family Medicine | Admitting: Family Medicine

## 2016-05-04 DIAGNOSIS — N644 Mastodynia: Secondary | ICD-10-CM | POA: Insufficient documentation

## 2016-06-22 DIAGNOSIS — M180 Bilateral primary osteoarthritis of first carpometacarpal joints: Secondary | ICD-10-CM | POA: Insufficient documentation

## 2016-06-28 ENCOUNTER — Emergency Department
Admission: EM | Admit: 2016-06-28 | Discharge: 2016-06-28 | Disposition: A | Discharge status: Home / Self Care | Payer: Medicare Other | Attending: Emergency Medicine | Admitting: Emergency Medicine

## 2016-06-28 ENCOUNTER — Encounter: Payer: Self-pay | Admitting: Emergency Medicine

## 2016-06-28 DIAGNOSIS — J449 Chronic obstructive pulmonary disease, unspecified: Secondary | ICD-10-CM | POA: Diagnosis not present

## 2016-06-28 DIAGNOSIS — J45909 Unspecified asthma, uncomplicated: Secondary | ICD-10-CM | POA: Insufficient documentation

## 2016-06-28 DIAGNOSIS — F17210 Nicotine dependence, cigarettes, uncomplicated: Secondary | ICD-10-CM | POA: Insufficient documentation

## 2016-06-28 DIAGNOSIS — K922 Gastrointestinal hemorrhage, unspecified: Secondary | ICD-10-CM | POA: Diagnosis not present

## 2016-06-28 DIAGNOSIS — Z79899 Other long term (current) drug therapy: Secondary | ICD-10-CM | POA: Insufficient documentation

## 2016-06-28 DIAGNOSIS — K920 Hematemesis: Secondary | ICD-10-CM | POA: Diagnosis present

## 2016-06-28 LAB — CBC
HCT: 38.5 % (ref 35.0–47.0)
Hemoglobin: 12.9 g/dL (ref 12.0–16.0)
MCH: 30.2 pg (ref 26.0–34.0)
MCHC: 33.4 g/dL (ref 32.0–36.0)
MCV: 90.4 fL (ref 80.0–100.0)
PLATELETS: 255 10*3/uL (ref 150–440)
RBC: 4.26 MIL/uL (ref 3.80–5.20)
RDW: 22.4 % — AB (ref 11.5–14.5)
WBC: 7.4 10*3/uL (ref 3.6–11.0)

## 2016-06-28 LAB — COMPREHENSIVE METABOLIC PANEL
ALT: 16 U/L (ref 14–54)
AST: 23 U/L (ref 15–41)
Albumin: 3.7 g/dL (ref 3.5–5.0)
Alkaline Phosphatase: 136 U/L — ABNORMAL HIGH (ref 38–126)
Anion gap: 7 (ref 5–15)
BUN: 17 mg/dL (ref 6–20)
CHLORIDE: 110 mmol/L (ref 101–111)
CO2: 26 mmol/L (ref 22–32)
CREATININE: 0.81 mg/dL (ref 0.44–1.00)
Calcium: 8.7 mg/dL — ABNORMAL LOW (ref 8.9–10.3)
GFR calc Af Amer: >60 mL/min (ref >60)
GFR calc non Af Amer: >60 mL/min (ref >60)
GLUCOSE: 123 mg/dL — AB (ref 65–99)
Potassium: 3.8 mmol/L (ref 3.5–5.1)
SODIUM: 143 mmol/L (ref 135–145)
Total Bilirubin: 0.6 mg/dL (ref 0.3–1.2)
Total Protein: 6.6 g/dL (ref 6.5–8.1)

## 2016-06-28 LAB — PROTIME-INR
INR: 0.95
Prothrombin Time: 12.7 seconds (ref 11.4–15.2)

## 2016-06-28 LAB — TYPE AND SCREEN
ABO/RH(D): A POS
Antibody Screen: NEGATIVE

## 2016-06-28 LAB — TROPONIN I

## 2016-06-28 LAB — APTT: aPTT: 27 seconds (ref 24–36)

## 2016-06-28 NOTE — ED Notes (Signed)
Pt given ginger ale and saltine crackers.  

## 2016-06-28 NOTE — ED Triage Notes (Signed)
Black diarrhea for the past 30 hours, vomited coffee ground like emesis in am, states she is severely anemic, takes iron. Appears pale but in no distress.

## 2016-06-28 NOTE — ED Notes (Signed)
Pt states she wants to go home and not be admitted d/t wanting food, needing her medication from home, and needing to take care of dogs. Dr. Corky Downs aware and DC pt.

## 2016-06-28 NOTE — ED Notes (Signed)
Pt walked out of room with IV. Pt walked back to room to remove IV. Pt stated she was going to go get seafood, pt informed that this will more than likely make her sick again. Pt informed about the BRAT diet. Pt verbalized she wasn't going to follow that, "states I don't care." stated to this RN "I will come back if I can get the humane society to take care of my dogs." Pt ambulated to lobby after signing AMA.

## 2016-07-05 NOTE — ED Provider Notes (Signed)
[facility] Emergency Department Provider Note   ____________________________________________    I have reviewed the triage vital signs and the nursing notes.   HISTORY  Chief Complaint Diarrhea; Rectal Bleeding; and Emesis     HPI Carla Conley is a 56 y.o. female who presents with complaints of vomiting blood. Patient reports over the last day and a half she has had a several episodes of vomiting coffee-ground like material. She called her doctor's office and they told her to come to the emergency room. She denies abdominal pain. She denies dizziness. She does report a history of anemia. No fevers or chills. No diarrhea. No black stools. No history of the same.    Past Medical History:  Diagnosis Date  . Acid reflux   . Asthma   . COPD (chronic obstructive pulmonary disease) ([place])   . Depression   . Frequent headaches   . Head injury   . Paranoid disorder ([place])   . Psychiatric disorder   . Seasonal allergic rhinitis   . Suicidal overdose [facility])     Patient Active Problem List   Diagnosis Date Noted  . IBS (irritable bowel syndrome) 06/12/2015  . Urinary retention 06/12/2015  . Smoker 06/12/2015  . Abnormal Pap smear of cervix 03/23/2013  . Allergic rhinitis 08/03/2012  . Airway hyperreactivity 08/03/2012  . Bipolar affective disorder ([place]) 08/03/2012  . Borderline personality disorder 08/03/2012  . Chronic obstructive pulmonary disease ([place]) 08/03/2012  . Acid reflux 08/03/2012    Past Surgical History:  Procedure Laterality Date  . NASAL SEPTUM SURGERY    . TONSILLECTOMY      Prior to Admission medications   Medication Sig Start Date End Date Taking? Authorizing Provider  cetirizine (ZYRTEC) 10 MG tablet Take 1 tablet (10 mg total) by mouth daily. Patient taking differently: Take 10 mg by mouth 2 (two) times daily.  06/12/15  Yes Plonk, Gwyndolyn Saxon, MD  diazepam (VALIUM) 10 MG tablet Take 10 mg by mouth 3 (three) times daily. psych    Yes [provider]  docusate sodium (COLACE) 100 MG capsule Take 100 mg by mouth 2 (two) times daily.   Yes [provider]  Ferrous Sulfate (IRON) 325 (65 Fe) MG TABS Take by mouth.   Yes [provider]  omeprazole (PRILOSEC) 20 MG capsule Take one capsule once daily. Patient taking differently: Take 40 mg by mouth daily. Take one capsule once daily. 05/31/15  Yes Paulina Fusi, MD  tiotropium (SPIRIVA HANDIHALER) 18 MCG inhalation capsule Place 1 capsule (18 mcg total) into inhaler and inhale daily. 06/12/15  Yes Plonk, Gwyndolyn Saxon, MD  tiZANidine (ZANAFLEX) 2 MG tablet Take by mouth every 6 (six) hours as needed for muscle spasms.   Yes [provider]  albuterol (PROVENTIL HFA;VENTOLIN HFA) 108 (90 Base) MCG/ACT inhaler Inhale 2 puffs into the lungs every 6 (six) hours as needed for wheezing or shortness of breath. 05/31/15   Paulina Fusi, MD  Chlorpheniramine-Phenylephrine (SUDAFED PE SINUS/ALLERGY PO) Take by mouth.    [provider]  naproxen sodium (ANAPROX) 220 MG tablet Take 220 mg by mouth 2 (two) times daily as needed.    [provider]     Allergies Quetiapine; Seroquel [quetiapine fumarate]; Mobic [meloxicam]; Zyprexa [olanzapine]; and Imipramine  No family history on file.  Social History Social History  Substance Use Topics  . Smoking status: Current Every Day Smoker    Packs/day: 0.50    Types: Cigarettes  . Smokeless tobacco: Never Used  .  Alcohol use 1.2 oz/week    2 Glasses of wine per week    Review of Systems  Constitutional: No fever/chills Eyes: No visual changes.  ENT: No sore throat. Cardiovascular: Denies chest pain. Respiratory: Denies shortness of breath. Gastrointestinal: As above Genitourinary: Negative for dysuria. Musculoskeletal: Negative for back pain. Skin: Negative for rash. Neurological: Negative for headaches or weakness   ____________________________________________   PHYSICAL  EXAM:  VITAL SIGNS: ED Triage Vitals  Enc Vitals Group     BP 06/28/16 0921 131/73     Pulse Rate 06/28/16 0921 91     Resp 06/28/16 0921 18     Temp 06/28/16 0922 98 F (36.7 C)     Temp Source 06/28/16 0922 Oral     SpO2 06/28/16 0921 98 %     Weight 06/28/16 0922 84.4 kg (186 lb)     Height 06/28/16 0922 1.676 m (5\' 6" )     Head Circumference --      Peak Flow --      Pain Score 06/28/16 0921 5     Pain Loc --      Pain Edu? --      Excl. in [place]? --     Constitutional: Alert and oriented. No acute distress. Eyes: Conjunctivae are normal.  Head: Atraumatic. Nose: No congestion/rhinnorhea. Mouth/Throat: Mucous membranes are moist.    Cardiovascular: Normal rate, regular rhythm. Grossly normal heart sounds.  Good peripheral circulation. Respiratory: Normal respiratory effort.  No retractions. Lungs CTAB. Gastrointestinal: Soft and nontender. No distention.  No CVA tenderness. Guaiac positive brown stool Genitourinary: deferred Musculoskeletal: No lower extremity tenderness nor edema.  Warm and well perfused Neurologic:  Normal speech and language. No gross focal neurologic deficits are appreciated.  Skin:  Skin is warm, dry and intact. No rash noted. Psychiatric: Mood and affect are normal. Speech and behavior are normal.  ____________________________________________   LABS (all labs ordered are listed, but only abnormal results are displayed)  Labs Reviewed  COMPREHENSIVE METABOLIC PANEL - Abnormal; Notable for the following:       Result Value   Glucose, Bld 123 (*)    Calcium 8.7 (*)    Alkaline Phosphatase 136 (*)    All other components within normal limits  CBC - Abnormal; Notable for the following:    RDW 22.4 (*)    All other components within normal limits  PROTIME-INR  APTT  TROPONIN I  POC OCCULT BLOOD, ED  TYPE AND SCREEN   ____________________________________________  EKG  ED ECG REPORT I, Lavonia Drafts, the attending physician, personally  viewed and interpreted this ECG.  Date: 07/05/2016  Rate: 60 Rhythm: normal sinus rhythm QRS Axis: normal Intervals: normal ST/T Wave abnormalities: normal Conduction Disturbances: none Narrative Interpretation: unremarkable  ____________________________________________  RADIOLOGY   ____________________________________________   PROCEDURES  Procedure(s) performed: No    Critical Care performed: No ____________________________________________   INITIAL IMPRESSION / ASSESSMENT AND PLAN / ED COURSE  Pertinent labs & imaging results that were available during my care of the patient were reviewed by me and considered in my medical decision making (see chart for details).  Patient well-appearing and in no acute distress. Hemoglobin is stable. She does have guaiac positive stool. Strong concern for upper GI bleed/peptic ulcer. 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 She reports she will return when she arranges care for her animals. I discussed with her that this is quite risky but she  has decisional capacity and understands the risk of further bleeding/syncope/death.  She is leaving AGAINST MEDICAL ADVICE    ____________________________________________   FINAL CLINICAL IMPRESSION(S) / ED DIAGNOSES  Final diagnoses:  Upper GI bleed      NEW MEDICATIONS STARTED DURING THIS VISIT:  Discharge Medication List as of 06/28/2016 12:06 PM       Note:  This document was prepared using Dragon voice recognition software and may include unintentional dictation errors.    Lavonia Drafts, MD 07/05/16 1550

## 2016-10-07 ENCOUNTER — Encounter: Payer: Self-pay | Admitting: *Deleted

## 2016-10-08 ENCOUNTER — Ambulatory Visit: Payer: Medicare Other | Admitting: Anesthesiology

## 2016-10-08 ENCOUNTER — Encounter
Admission: RE | Disposition: A | Discharge status: Home / Self Care | Payer: Self-pay | Source: Ambulatory Visit | Attending: Gastroenterology

## 2016-10-08 ENCOUNTER — Ambulatory Visit
Admission: RE | Admit: 2016-10-08 | Discharge: 2016-10-08 | Disposition: A | Discharge status: Home / Self Care | Payer: Medicare Other | Source: Ambulatory Visit | Attending: Gastroenterology | Admitting: Gastroenterology

## 2016-10-08 DIAGNOSIS — D649 Anemia, unspecified: Secondary | ICD-10-CM | POA: Diagnosis not present

## 2016-10-08 DIAGNOSIS — D121 Benign neoplasm of appendix: Secondary | ICD-10-CM | POA: Insufficient documentation

## 2016-10-08 DIAGNOSIS — M1990 Unspecified osteoarthritis, unspecified site: Secondary | ICD-10-CM | POA: Insufficient documentation

## 2016-10-08 DIAGNOSIS — F419 Anxiety disorder, unspecified: Secondary | ICD-10-CM | POA: Insufficient documentation

## 2016-10-08 DIAGNOSIS — R7303 Prediabetes: Secondary | ICD-10-CM | POA: Diagnosis not present

## 2016-10-08 DIAGNOSIS — K224 Dyskinesia of esophagus: Secondary | ICD-10-CM | POA: Diagnosis not present

## 2016-10-08 DIAGNOSIS — F22 Delusional disorders: Secondary | ICD-10-CM | POA: Diagnosis not present

## 2016-10-08 DIAGNOSIS — D125 Benign neoplasm of sigmoid colon: Secondary | ICD-10-CM | POA: Diagnosis not present

## 2016-10-08 DIAGNOSIS — K621 Rectal polyp: Secondary | ICD-10-CM | POA: Insufficient documentation

## 2016-10-08 DIAGNOSIS — R1013 Epigastric pain: Secondary | ICD-10-CM | POA: Insufficient documentation

## 2016-10-08 DIAGNOSIS — K2950 Unspecified chronic gastritis without bleeding: Secondary | ICD-10-CM | POA: Diagnosis not present

## 2016-10-08 DIAGNOSIS — Z888 Allergy status to other drugs, medicaments and biological substances status: Secondary | ICD-10-CM | POA: Diagnosis not present

## 2016-10-08 DIAGNOSIS — G4730 Sleep apnea, unspecified: Secondary | ICD-10-CM | POA: Insufficient documentation

## 2016-10-08 DIAGNOSIS — J449 Chronic obstructive pulmonary disease, unspecified: Secondary | ICD-10-CM | POA: Insufficient documentation

## 2016-10-08 DIAGNOSIS — F329 Major depressive disorder, single episode, unspecified: Secondary | ICD-10-CM | POA: Insufficient documentation

## 2016-10-08 DIAGNOSIS — K219 Gastro-esophageal reflux disease without esophagitis: Secondary | ICD-10-CM | POA: Insufficient documentation

## 2016-10-08 DIAGNOSIS — Z79899 Other long term (current) drug therapy: Secondary | ICD-10-CM | POA: Insufficient documentation

## 2016-10-08 DIAGNOSIS — K3189 Other diseases of stomach and duodenum: Secondary | ICD-10-CM | POA: Diagnosis not present

## 2016-10-08 DIAGNOSIS — K449 Diaphragmatic hernia without obstruction or gangrene: Secondary | ICD-10-CM | POA: Insufficient documentation

## 2016-10-08 DIAGNOSIS — K529 Noninfective gastroenteritis and colitis, unspecified: Secondary | ICD-10-CM | POA: Diagnosis not present

## 2016-10-08 DIAGNOSIS — K5730 Diverticulosis of large intestine without perforation or abscess without bleeding: Secondary | ICD-10-CM | POA: Insufficient documentation

## 2016-10-08 HISTORY — DX: Anemia, unspecified: D64.9

## 2016-10-08 HISTORY — DX: Diarrhea, unspecified: R19.7

## 2016-10-08 HISTORY — PX: ESOPHAGOGASTRODUODENOSCOPY (EGD) WITH PROPOFOL: SHX5813

## 2016-10-08 HISTORY — DX: Unspecified osteoarthritis, unspecified site: M19.90

## 2016-10-08 HISTORY — DX: Prediabetes: R73.03

## 2016-10-08 HISTORY — DX: Sleep apnea, unspecified: G47.30

## 2016-10-08 HISTORY — PX: COLONOSCOPY WITH PROPOFOL: SHX5780

## 2016-10-08 HISTORY — DX: Anxiety disorder, unspecified: F41.9

## 2016-10-08 SURGERY — COLONOSCOPY WITH PROPOFOL
Anesthesia: General

## 2016-10-08 MED ORDER — LIDOCAINE HCL (PF) 2 % IJ SOLN
INTRAMUSCULAR | Status: DC | PRN
  Administered 2016-10-08: 100 mg via INTRADERMAL

## 2016-10-08 MED ORDER — PROPOFOL 500 MG/50ML IV EMUL
INTRAVENOUS | Status: AC
  Filled 2016-10-08: qty 50

## 2016-10-08 MED ORDER — PHENYLEPHRINE HCL 10 MG/ML IJ SOLN
INTRAMUSCULAR | Status: DC | PRN
  Administered 2016-10-08 (×2): 100 ug via INTRAVENOUS

## 2016-10-08 MED ORDER — LIDOCAINE HCL (PF) 2 % IJ SOLN
INTRAMUSCULAR | Status: AC
  Filled 2016-10-08: qty 2

## 2016-10-08 MED ORDER — FENTANYL CITRATE (PF) 100 MCG/2ML IJ SOLN
INTRAMUSCULAR | Status: DC | PRN
  Administered 2016-10-08 (×2): 50 ug via INTRAVENOUS

## 2016-10-08 MED ORDER — MIDAZOLAM HCL 2 MG/2ML IJ SOLN
INTRAMUSCULAR | Status: DC | PRN
  Administered 2016-10-08: 2 mg via INTRAVENOUS

## 2016-10-08 MED ORDER — BUTAMBEN-TETRACAINE-BENZOCAINE 2-2-14 % EX AERO
INHALATION_SPRAY | CUTANEOUS | Status: DC | PRN
  Administered 2016-10-08: 2 via TOPICAL

## 2016-10-08 MED ORDER — PROPOFOL 10 MG/ML IV BOLUS
INTRAVENOUS | Status: DC | PRN
  Administered 2016-10-08: 100 mg via INTRAVENOUS

## 2016-10-08 MED ORDER — SODIUM CHLORIDE 0.9 % IV SOLN: INTRAVENOUS | Status: DC

## 2016-10-08 MED ORDER — FENTANYL CITRATE (PF) 100 MCG/2ML IJ SOLN
INTRAMUSCULAR | Status: AC
  Filled 2016-10-08: qty 2

## 2016-10-08 MED ORDER — PROPOFOL 10 MG/ML IV BOLUS
INTRAVENOUS | Status: AC
  Filled 2016-10-08: qty 20

## 2016-10-08 MED ORDER — MIDAZOLAM HCL 2 MG/2ML IJ SOLN
INTRAMUSCULAR | Status: AC
  Filled 2016-10-08: qty 2

## 2016-10-08 MED ORDER — PROPOFOL 500 MG/50ML IV EMUL
INTRAVENOUS | Status: DC | PRN
  Administered 2016-10-08: 100 ug/kg/min via INTRAVENOUS

## 2016-10-08 MED ORDER — SODIUM CHLORIDE 0.9 % IV SOLN
INTRAVENOUS | Status: DC
  Administered 2016-10-08: 09:00:00 via INTRAVENOUS

## 2016-10-08 NOTE — Transfer of Care (Signed)
Immediate Anesthesia Transfer of Care Note  Patient: Carla Conley  Procedure(s) Performed: Procedure(s): COLONOSCOPY WITH PROPOFOL (N/A) ESOPHAGOGASTRODUODENOSCOPY (EGD) WITH PROPOFOL (N/A)  Patient Location: PACU and Endoscopy Unit  Anesthesia Type:General  Level of Consciousness: awake, alert  and oriented  Airway & Oxygen Therapy: Patient Spontanous Breathing and Patient connected to nasal cannula oxygen  Post-op Assessment: Report given to RN and Post -op Vital signs reviewed and stable  Post vital signs: Reviewed and stable  Last Vitals:  Vitals:   10/08/16 1201 10/08/16 1204  BP:  (!) 89/59  Pulse:  78  Resp:  20  Temp: (!) 36 C   SpO2:  99%    Last Pain:  Vitals:   10/08/16 1201  TempSrc: Tympanic         Complications: No apparent anesthesia complications

## 2016-10-08 NOTE — Anesthesia Post-op Follow-up Note (Signed)
Anesthesia QCDR form completed.        

## 2016-10-08 NOTE — H&P (Signed)
Outpatient short stay form Pre-procedure 10/08/2016 10:52 AM Lollie Sails MD  Primary Physician: Dr. Arrie Aran  Reason for visit:  EGD and colonoscopy  History of present illness:  Patient is a 56 year old female presenting today as above. She had a recent hospitalization with suspected melena however she did not stay for that hospitalization. She does have a history of NSAID use. She denies using any NSAIDs or aspirin products since that time. She has been taking Prilosec 40 mg a day. She has had issues with chronic diarrhea. She is treated for anxiety.    Current Facility-Administered Medications:  .  0.9 %  sodium chloride infusion, , Intravenous, Continuous, Lollie Sails, MD, Last Rate: 20 mL/hr at 10/08/16 0902 .  0.9 %  sodium chloride infusion, , Intravenous, Continuous, Lollie Sails, MD  Prescriptions Prior to Admission  Medication Sig Dispense Refill Last Dose  . docusate sodium (COLACE) 100 MG capsule Take 100 mg by mouth 2 (two) times daily.   10/07/2016 at Unknown time  . tiZANidine (ZANAFLEX) 2 MG tablet Take by mouth every 6 (six) hours as needed for muscle spasms.   10/06/2016 at Unknown time  . albuterol (PROVENTIL HFA;VENTOLIN HFA) 108 (90 Base) MCG/ACT inhaler Inhale 2 puffs into the lungs every 6 (six) hours as needed for wheezing or shortness of breath. 1 Inhaler 1 10/04/2016  . cetirizine (ZYRTEC) 10 MG tablet Take 1 tablet (10 mg total) by mouth daily. (Patient taking differently: Take 10 mg by mouth 2 (two) times daily. ) 30 tablet 2 06/25/2016 at 0700  . Chlorpheniramine-Phenylephrine (SUDAFED PE SINUS/ALLERGY PO) Take by mouth.   prn at prn  . diazepam (VALIUM) 10 MG tablet Take 10 mg by mouth 3 (three) times daily. psych   10/06/2016  . Ferrous Sulfate (IRON) 325 (65 Fe) MG TABS Take by mouth.   Not Taking at Unknown time  . naproxen sodium (ANAPROX) 220 MG tablet Take 220 mg by mouth 2 (two) times daily as needed.   Not Taking at Unknown time  .  omeprazole (PRILOSEC) 20 MG capsule Take one capsule once daily. (Patient taking differently: Take 40 mg by mouth daily. Take one capsule once daily.) 21 capsule 0 10/07/2016  . tiotropium (SPIRIVA HANDIHALER) 18 MCG inhalation capsule Place 1 capsule (18 mcg total) into inhaler and inhale daily. 30 capsule 2 09/24/2016     Allergies  Allergen Reactions  . Quetiapine Anaphylaxis  . Seroquel [Quetiapine Fumarate] Anaphylaxis    Mulitple psychiatric drug reactions, but unable to list  . Celebrex [Celecoxib]   . Mobic [Meloxicam]   . Zyprexa [Olanzapine]     numbness  . Imipramine Other (See Comments)    Gas pains     Past Medical History:  Diagnosis Date  . Acid reflux   . Anemia   . Anxiety   . Asthma   . COPD (chronic obstructive pulmonary disease) ([place])   . Depression   . Diarrhea   . Frequent headaches   . Head injury   . Osteoarthritis   . Paranoid disorder ([place])   . Pre-diabetes   . Psychiatric disorder   . Seasonal allergic rhinitis   . Sleep apnea   . Suicidal overdose ([place])     Review of systems:      Physical Exam    Heart and lungs: Regular rate and rhythm without rub or gallop, lungs are bilaterally clear    HEENT: Normocephalic atraumatic eyes are anicteric    Other:  Pertinant exam for procedure: Soft nontender nondistended bowel sounds positive normoactive.    Planned proceedures: EGD, colonoscopy and indicated procedures. I have discussed the risks benefits and complications of procedures to include not limited to bleeding, infection, perforation and the risk of sedation and the patient wishes to proceed.    Lollie Sails, MD Gastroenterology 10/08/2016  10:52 AM

## 2016-10-08 NOTE — Anesthesia Preprocedure Evaluation (Signed)
Anesthesia Evaluation  Patient identified by MRN, date of birth, ID band Patient awake    Reviewed: Allergy & Precautions, NPO status , Patient's Chart, lab work & pertinent test results  Airway Mallampati: III  TM Distance: <3 FB     Dental  (+) Poor Dentition   Pulmonary asthma , sleep apnea , COPD, Current Smoker,    Pulmonary exam normal        Cardiovascular Normal cardiovascular exam     Neuro/Psych  Headaches, PSYCHIATRIC DISORDERS Anxiety Depression Bipolar Disorder    GI/Hepatic Neg liver ROS, GERD  Medicated,  Endo/Other  negative endocrine ROS  Renal/GU negative Renal ROS     Musculoskeletal  (+) Arthritis , Osteoarthritis,    Abdominal Normal abdominal exam  (+)   Peds  Hematology  (+) anemia ,   Anesthesia Other Findings   Reproductive/Obstetrics                             Anesthesia Physical Anesthesia Plan  ASA: III  Anesthesia Plan: General   Post-op Pain Management:    Induction: Intravenous  PONV Risk Score and Plan:   Airway Management Planned: Nasal Cannula  Additional Equipment:   Intra-op Plan:   Post-operative Plan:   Informed Consent: I have reviewed the patients History and Physical, chart, labs and discussed the procedure including the risks, benefits and alternatives for the proposed anesthesia with the patient or authorized representative who has indicated his/her understanding and acceptance.   Dental advisory given  Plan Discussed with: CRNA and Surgeon  Anesthesia Plan Comments:         Anesthesia Quick Evaluation

## 2016-10-08 NOTE — Op Note (Signed)
[facility] Gastroenterology Patient Name: Janah Mcculloh Procedure Date: 10/08/2016 10:49 AM MRN: [id number] Account #: [id number] Date of Birth: 29-Jun-1960 Admit Type: Outpatient Age: 56 Room: [facility] ENDO ROOM 3 Gender: Female Note Status: Finalized Procedure:            Colonoscopy Indications:          Lower abdominal pain, Chronic diarrhea Providers:            Lollie Sails, MD Referring MD:         Ardyth Man. Bobette Mo (Referring MD) Medicines:            Monitored Anesthesia Care Complications:        No immediate complications. Procedure:            Pre-Anesthesia Assessment:                       - ASA Grade Assessment: III - A patient with severe                        systemic disease.                       After obtaining informed consent, the colonoscope was                        passed under direct vision. Throughout the procedure,                        the patient's blood pressure, pulse, and oxygen                        saturations were monitored continuously. The                        Colonoscope was introduced through the anus and                        advanced to the the cecum, identified by appendiceal                        orifice and ileocecal valve. The colonoscopy was                        performed with moderate difficulty due to a tortuous                        colon. Successful completion of the procedure was aided                        by changing the patient to a supine position, changing                        the patient to a prone position and using manual                        pressure. The patient tolerated the procedure well. The                        quality of the bowel preparation was good. Findings:      Two sessile polyps were found  in the rectum. The polyps were 2 to 4 mm       in size. These polyps were removed with a cold biopsy forceps. Resection       and retrieval were complete.      A 4 mm polyp was  found in the mid sigmoid colon. The polyp was sessile.       The polyp was removed with a cold snare. Resection and retrieval were       complete.      A 1 mm polyp was found in the appendiceal orifice. The polyp was       sessile. The polyp was removed with a cold biopsy forceps. Resection and       retrieval were complete.      Biopsies for histology were taken with a cold forceps from the right       colon and left colon for evaluation of microscopic colitis.      A 1 mm polyp was found in the rectum. The polyp was sessile. The polyp       was removed with a cold biopsy forceps. Resection and retrieval were       complete.      Multiple small-mouthed diverticula were found in the sigmoid colon and       descending colon.      The digital rectal exam was normal. Impression:           - Two 2 to 4 mm polyps in the rectum, removed with a                        cold biopsy forceps. Resected and retrieved.                       - One 4 mm polyp in the mid sigmoid colon, removed with                        a cold snare. Resected and retrieved.                       - One 1 mm polyp at the appendiceal orifice, removed                        with a cold biopsy forceps. Resected and retrieved.                       - One 1 mm polyp in the rectum, removed with a cold                        biopsy forceps. Resected and retrieved.                       - Diverticulosis in the sigmoid colon and in the                        descending colon.                       - Biopsies were taken with a cold forceps from the                        right colon and left colon for evaluation of  microscopic colitis. Recommendation:       - Discharge patient to home.                       - Await pathology results.                       - Telephone GI clinic for pathology results in 1 week.                       - Return to GI clinic in 4 weeks. Procedure Code(s):    --- Professional ---                        [phone], Colonoscopy, flexible; with removal of tumor(s),                        polyp(s), or other lesion(s) by snare technique                       [id number], 68, Colonoscopy, flexible; with biopsy, single                        or multiple Diagnosis Code(s):    --- Professional ---                       K62.1, Rectal polyp                       D12.5, Benign neoplasm of sigmoid colon                       D12.1, Benign neoplasm of appendix                       R10.30, Lower abdominal pain, unspecified                       K52.9, Noninfective gastroenteritis and colitis,                        unspecified                       K57.30, Diverticulosis of large intestine without                        perforation or abscess without bleeding CPT copyright 2016 American Medical Association. All rights reserved. The codes documented in this report are preliminary and upon coder review may  be revised to meet current compliance requirements. Lollie Sails, MD 10/08/2016 12:00:56 PM This report has been signed electronically. Number of Addenda: 0 Note Initiated On: 10/08/2016 10:49 AM Scope Withdrawal Time: 0 hours 9 minutes 26 seconds  Total Procedure Duration: 0 hours 30 minutes 27 seconds       [facility]

## 2016-10-08 NOTE — Anesthesia Postprocedure Evaluation (Signed)
Anesthesia Post Note  Patient: Carla Conley  Procedure(s) Performed: Procedure(s) (LRB): COLONOSCOPY WITH PROPOFOL (N/A) ESOPHAGOGASTRODUODENOSCOPY (EGD) WITH PROPOFOL (N/A)  Patient location during evaluation: PACU Anesthesia Type: General Level of consciousness: awake and alert and oriented Pain management: pain level controlled Vital Signs Assessment: post-procedure vital signs reviewed and stable Respiratory status: spontaneous breathing Cardiovascular status: blood pressure returned to baseline Anesthetic complications: no     Last Vitals:  Vitals:   10/08/16 1221 10/08/16 1231  BP: 112/69 112/62  Pulse: 82 76  Resp: (!) 21 20  Temp:    SpO2: 100% 100%    Last Pain:  Vitals:   10/08/16 1201  TempSrc: Tympanic                 Karsten Vaughn

## 2016-10-08 NOTE — Op Note (Addendum)
[facility] Gastroenterology Patient Name: Carla Conley Procedure Date: 10/08/2016 10:50 AM MRN: [id number] Account #: [id number] Date of Birth: 1960/08/16 Admit Type: Outpatient Age: 56 Room: [facility] ENDO ROOM 3 Gender: Female Note Status: Finalized Procedure:            Upper GI endoscopy Indications:          Epigastric abdominal pain, Coffee-ground emesis,                        Melena, Diarrhea, Nausea with vomiting, OTHER Providers:            Lollie Sails, MD Referring MD:         Ardyth Man. Bobette Mo (Referring MD) Medicines:            Monitored Anesthesia Care Complications:        No immediate complications. Procedure:            Pre-Anesthesia Assessment:                       - ASA Grade Assessment: III - A patient with severe                        systemic disease.                       After obtaining informed consent, the endoscope was                        passed under direct vision. Throughout the procedure,                        the patient's blood pressure, pulse, and oxygen                        saturations were monitored continuously. The Endoscope                        was introduced through the mouth, and advanced to the                        fourth part of duodenum. The upper GI endoscopy was                        accomplished without difficulty. The patient tolerated                        the procedure well. Findings:      The Z-line was regular. Biopsies were taken with a cold forceps for       histology.      A small hiatal hernia was present.      The entire examined stomach was normal. Biopsies were taken with a cold       forceps for histology.      Diffuse mild mucosal variance characterized by smoothness was found in       the entire duodenum. Biopsies were taken with a cold forceps for       histology.      The cardia and gastric fundus were normal on retroflexion normal       otherwise.      Abnormal motility was  noted in the lower third of  the esophagus. The       cricopharyngeus was normal. There is spasticity of the esophageal body.       The distal esophagus/lower esophageal sphincter is spastic, but gives up       passage to the endoscope. Secondary peristaltic waves are noted. Impression:           - Z-line regular. Biopsied.                       - Small hiatal hernia.                       - Normal stomach. Biopsied.                       - Mucosal variant in the duodenum. Biopsied. Recommendation:       - Continue present medications.                       - No aspirin, ibuprofen, naproxen, or other                        non-steroidal anti-inflammatory drugs. Procedure Code(s):    --- Professional ---                       [phone], Esophagogastroduodenoscopy, flexible, transoral;                        with biopsy, single or multiple Diagnosis Code(s):    --- Professional ---                       K44.9, Diaphragmatic hernia without obstruction or                        gangrene                       K31.89, Other diseases of stomach and duodenum                       R10.13, Epigastric pain                       K92.0, Hematemesis                       K92.1, Melena (includes Hematochezia)                       R19.7, Diarrhea, unspecified                       R11.2, Nausea with vomiting, unspecified CPT copyright 2016 American Medical Association. All rights reserved. The codes documented in this report are preliminary and upon coder review may  be revised to meet current compliance requirements. Lollie Sails, MD 10/08/2016 11:22:00 AM This report has been signed electronically. Number of Addenda: 0 Note Initiated On: 10/08/2016 10:50 AM      [facility]

## 2016-10-11 ENCOUNTER — Encounter: Payer: Self-pay | Admitting: Gastroenterology

## 2016-10-11 LAB — SURGICAL PATHOLOGY

## 2017-05-03 ENCOUNTER — Other Ambulatory Visit
Admission: RE | Admit: 2017-05-03 | Discharge: 2017-05-03 | Disposition: A | Discharge status: Home / Self Care | Payer: Medicare HMO | Source: Ambulatory Visit | Attending: Student | Admitting: Student

## 2017-05-03 DIAGNOSIS — K529 Noninfective gastroenteritis and colitis, unspecified: Secondary | ICD-10-CM | POA: Diagnosis present

## 2017-05-03 LAB — GASTROINTESTINAL PANEL BY PCR, STOOL (REPLACES STOOL CULTURE)
ASTROVIRUS: NOT DETECTED
Adenovirus F40/41: NOT DETECTED
CYCLOSPORA CAYETANENSIS: NOT DETECTED
Campylobacter species: NOT DETECTED
Cryptosporidium: NOT DETECTED
ENTAMOEBA HISTOLYTICA: NOT DETECTED
ENTEROAGGREGATIVE E COLI (EAEC): NOT DETECTED
Enteropathogenic E coli (EPEC): NOT DETECTED
Enterotoxigenic E coli (ETEC): NOT DETECTED
GIARDIA LAMBLIA: NOT DETECTED
NOROVIRUS GI/GII: NOT DETECTED
Plesimonas shigelloides: NOT DETECTED
Rotavirus A: NOT DETECTED
SALMONELLA SPECIES: NOT DETECTED
SHIGELLA/ENTEROINVASIVE E COLI (EIEC): NOT DETECTED
Sapovirus (I, II, IV, and V): NOT DETECTED
Shiga like toxin producing E coli (STEC): NOT DETECTED
VIBRIO CHOLERAE: NOT DETECTED
VIBRIO SPECIES: NOT DETECTED
Yersinia enterocolitica: NOT DETECTED

## 2017-05-03 LAB — C DIFFICILE QUICK SCREEN W PCR REFLEX
C DIFFICLE (CDIFF) ANTIGEN: NEGATIVE
C Diff interpretation: NOT DETECTED
C Diff toxin: NEGATIVE

## 2017-05-04 ENCOUNTER — Other Ambulatory Visit: Payer: Self-pay

## 2017-05-04 ENCOUNTER — Inpatient Hospital Stay: Payer: Medicare HMO | Attending: Hematology and Oncology | Admitting: Hematology and Oncology

## 2017-05-04 ENCOUNTER — Encounter: Payer: Self-pay | Admitting: Hematology and Oncology

## 2017-05-04 ENCOUNTER — Inpatient Hospital Stay: Payer: Medicare HMO

## 2017-05-04 VITALS — BP 147/84 | HR 85 | Temp 98.8°F | Resp 20 | Wt 192.2 lb

## 2017-05-04 DIAGNOSIS — D509 Iron deficiency anemia, unspecified: Secondary | ICD-10-CM

## 2017-05-04 DIAGNOSIS — D649 Anemia, unspecified: Secondary | ICD-10-CM

## 2017-05-04 DIAGNOSIS — F418 Other specified anxiety disorders: Secondary | ICD-10-CM

## 2017-05-04 LAB — URINALYSIS, COMPLETE (UACMP) WITH MICROSCOPIC
Bilirubin Urine: NEGATIVE
Glucose, UA: NEGATIVE mg/dL
KETONES UR: NEGATIVE mg/dL
Leukocytes, UA: NEGATIVE
Nitrite: NEGATIVE
PH: 6 (ref 5.0–8.0)
Protein, ur: NEGATIVE mg/dL
SPECIFIC GRAVITY, URINE: 1.02 (ref 1.005–1.030)
WBC, UA: NONE SEEN WBC/hpf (ref 0–5)

## 2017-05-04 LAB — IRON AND TIBC
Iron: 22 ug/dL — ABNORMAL LOW (ref 28–170)
SATURATION RATIOS: 4 % — AB (ref 10.4–31.8)
TIBC: 544 ug/dL — ABNORMAL HIGH (ref 250–450)
UIBC: 522 ug/dL

## 2017-05-04 LAB — CBC WITH DIFFERENTIAL/PLATELET
Basophils Absolute: 0.1 10*3/uL (ref 0–0.1)
Basophils Relative: 1 %
Eosinophils Absolute: 0.1 10*3/uL (ref 0–0.7)
Eosinophils Relative: 1 %
HEMATOCRIT: 32.4 % — AB (ref 35.0–47.0)
Hemoglobin: 9.9 g/dL — ABNORMAL LOW (ref 12.0–16.0)
LYMPHS ABS: 2.1 10*3/uL (ref 1.0–3.6)
LYMPHS PCT: 27 %
MCH: 22.9 pg — ABNORMAL LOW (ref 26.0–34.0)
MCHC: 30.7 g/dL — AB (ref 32.0–36.0)
MCV: 74.7 fL — AB (ref 80.0–100.0)
MONOS PCT: 5 %
Monocytes Absolute: 0.4 10*3/uL (ref 0.2–0.9)
NEUTROS ABS: 5.1 10*3/uL (ref 1.4–6.5)
Neutrophils Relative %: 66 %
Platelets: 255 10*3/uL (ref 150–440)
RBC: 4.34 MIL/uL (ref 3.80–5.20)
RDW: 18.3 % — AB (ref 11.5–14.5)
WBC: 7.8 10*3/uL (ref 3.6–11.0)

## 2017-05-04 LAB — COMPREHENSIVE METABOLIC PANEL
ALBUMIN: 3.9 g/dL (ref 3.5–5.0)
ALT: 27 U/L (ref 14–54)
ANION GAP: 9 (ref 5–15)
AST: 36 U/L (ref 15–41)
Alkaline Phosphatase: 169 U/L — ABNORMAL HIGH (ref 38–126)
BUN: 10 mg/dL (ref 6–20)
CO2: 25 mmol/L (ref 22–32)
Calcium: 8.5 mg/dL — ABNORMAL LOW (ref 8.9–10.3)
Chloride: 103 mmol/L (ref 101–111)
Creatinine, Ser: 0.84 mg/dL (ref 0.44–1.00)
GFR calc Af Amer: >60 mL/min (ref >60)
GFR calc non Af Amer: >60 mL/min (ref >60)
Glucose, Bld: 102 mg/dL — ABNORMAL HIGH (ref 65–99)
POTASSIUM: 3.2 mmol/L — AB (ref 3.5–5.1)
SODIUM: 137 mmol/L (ref 135–145)
Total Bilirubin: 0.7 mg/dL (ref 0.3–1.2)
Total Protein: 7.5 g/dL (ref 6.5–8.1)

## 2017-05-04 LAB — FERRITIN: Ferritin: 5 ng/mL — ABNORMAL LOW (ref 11–307)

## 2017-05-04 LAB — T4, FREE: FREE T4: 0.77 ng/dL (ref 0.61–1.12)

## 2017-05-04 LAB — TSH: TSH: 3.192 u[IU]/mL (ref 0.350–4.500)

## 2017-05-04 LAB — FOLATE: Folate: 27 ng/mL (ref >5.9)

## 2017-05-04 LAB — VITAMIN B12: Vitamin B-12: 151 pg/mL — ABNORMAL LOW (ref 180–914)
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
## 2017-05-05 ENCOUNTER — Other Ambulatory Visit: Payer: Self-pay | Admitting: Urgent Care

## 2017-05-05 ENCOUNTER — Telehealth: Payer: Self-pay | Admitting: *Deleted

## 2017-05-05 DIAGNOSIS — D509 Iron deficiency anemia, unspecified: Secondary | ICD-10-CM

## 2017-05-05 DIAGNOSIS — D500 Iron deficiency anemia secondary to blood loss (chronic): Secondary | ICD-10-CM | POA: Insufficient documentation

## 2017-05-05 LAB — CALPROTECTIN, FECAL: Calprotectin, Fecal: 76 ug/g (ref 0–120)

## 2017-05-05 LAB — PANCREATIC ELASTASE, FECAL

## 2017-05-05 NOTE — Telephone Encounter (Signed)
1420 - I called patient back with appointments. Patient has called several times between the time that I hung up with her and now. It has only been about 10-15 minutes. Patient advised that her appointments were as follows:  1. Appointment with Dr. Mike Gip and myself at 1330 to discuss and review workup thus far.   2. Appointment for Venofer infusion and B12 injection at 1400. She will receive a schedule before leaving with all of her appointments outlined.  Patient verbalized understanding of the above. She states, "I will be there, and I better not have to f**king have to wait either". Message regarding telephone conversation between myself and patient shared with facility administration for review.

## 2017-05-05 NOTE — Telephone Encounter (Signed)
I returned a call to the patient to discuss lab results and plans for treatment. The person on the other end stated, "You have the wrong number". I apologized and ended the call. Number checked and I re-attempted call. I was again advised that I had the incorrect number. Call ended. Followed with with triage who was asked to call patient, get her on the line, and transfer the call to me. Will follow up.

## 2017-05-05 NOTE — Telephone Encounter (Signed)
I called patient at same number and said, "Hi is this Carla Conley, this is Carla Conley from the [facility] returning your call" she replied," I didn't call you" and then hung up. She did tell me when she first called at 1128 that she was getting ready to take her medicine and lie down. NP informed of patient response

## 2017-05-05 NOTE — Telephone Encounter (Addendum)
1340 - I returned a call to this patient to discuss her labs and plans for treatment. Immediately she began to yell and curse at me. I advised that patient that I, as well as other staff members, had tried to return her call earlier, however we have unable to reach her. Patient proceeding to yell and accuse me of lying. She continues to curse and would not allow me to speak. I listened to the patient, interrupting her only to ask that she stop yelling and cursing at me. After patient calmed down, I proceeded with the clinical components of the call. Patient was advised of the following:  1. Iron is low - we will plan on proceeding with weekly Venofer 200 mg IV x 3 doses. I asked her when she would like to begin. Patient states, "You motherf**kers are a bunch of liars. You god**n nurse told me on Tuesday that I would have an infusion on Friday. Here it is f**king Thursday and I still don't have an appointment". Patient was reminded that her initial contact with [place] was not until yesterday (Wednesday), and that several things have transpired in the background prior to me calling her. Patient has been seen in clinic, at which time labs were drawn. Labs resulted. Labs reviewed. Treatment plan sent to financials for approval of therapy. Official orders placed for treatment. Patient was called to review and schedule. Patient states, "Demetrios Isaacs, you sons of b**ches are going to let me sit here and die before you called me. I have been waiting all d**n day". I apologized to the patient and asked that we be allowed to proceed with her treatment. Patient continued to yell and curse.   2. Potassium low - discussed that this was likely dietary in nature. I encouraged patient to increase intake of potassium dense foods. Patient states, "See. I knew you f**kers were not listening to me. I told you yesterday that I don't eat. I am losing weight. I am dying the GI people told me. No one cares. You a**holes giving out bad food from  your so called food bank".   3.  B12 low - discussed that this was also likely dietary in nature. It could be related to a true underlying pernicious anemia. I advised patient of the plan to proceed with weekly B12 injections x 6, then monthly. We will plan on following her labs. Patient states, "I knew that b**ch lied to me. She must really be stupid. She told me yesterday that it would be 4". I reminded patient that Dr. Mike Gip discussed anticipated course of normal treatment. Patient reminded that we did not have resulted labs at the time of her clinic visit to be able to say exactly how many treatments she will need. Patient states, "It sounds like you are just trying to cover for that b**ch".  4.  Follow up appointments - I advised patient that we typically see new patients in clinic, get labs, and bring them back to discuss the results after about a week. Patient states, "A week? I might not have a f**king week. GI told me I have 10-14 days to live. That b**ch thought that was funny". I made attempts to redirect the patient. I advised her that I would like to set her up to return to discuss her treatments with Dr. Mike Gip. Patient states, "I am never going back to [facility] or seeing that b**ch again. She was rude. She has a corn-cob up her ass and I am going to  be the one to tell her how to take it out". I calmly advised patient that Dr. Mike Gip was her physician and that she had to be seen in follow up for treatment to continue here at the cancer center. Patient states, "I have already spoken to [place]. She told me that would not the case at all. She told me that there are 5 doctors there, and that I have the right to pick who I wanted. She told me that ya'll would lie to me and say that I couldn't change". I advised her that I had spoken with facility administration and been advised that patient would need to remain with Dr. Mike Gip. Patient states, "Demetrios Isaacs, they told me I could talk to some Freida Busman guy. I  don't need to talk to [facility]. I am the patient, and what I say goes. As far as I am concerned, Freida Busman can go f**k himself". I again asked patient to refrain from yelling, curing, and speaking poorly of [facility] staff. Patient states, "Just hurry the f**k up and schedule me to see the b**ch then, I will plan on telling her what I think of her".  All of this information was shared with my attending physician, Dr. Mike Gip, as well as with center nursing director, Doristine Johns. Patient has called several times today and berated cancer center staff. She has spoken with Dellia Nims, [place], and myself. I advised patient that I would return a call to her once I received her appointment dates and times from scheduling. Patient states, "Fine. Just hurry the hell up. I am tired of waiting. I am having to suffer because you dumba**es are incompetent".

## 2017-05-05 NOTE — Telephone Encounter (Signed)
Patient called very upset that no one will give her her lab results form yesterday and states that her GI told her she only has 10 - 14 days to live. She was very upset stating that she will NEVER go to [facility] clinic again and that everyone there but the lab girl was rude to her, especially the doctor, stating "she was a bitch and I will never see her again" She states she wants to come to [facility] for her iron treatments and that she wants s different doctor. I offered for her to speak with Earlean Shawl, but she declined.  Please advise what to do regarding her lab results and appts.

## 2017-05-06 ENCOUNTER — Other Ambulatory Visit: Payer: Self-pay | Admitting: Hematology and Oncology

## 2017-05-06 ENCOUNTER — Inpatient Hospital Stay (HOSPITAL_BASED_OUTPATIENT_CLINIC_OR_DEPARTMENT_OTHER): Payer: Medicare HMO | Admitting: Hematology and Oncology

## 2017-05-06 ENCOUNTER — Inpatient Hospital Stay: Payer: Medicare HMO

## 2017-05-06 DIAGNOSIS — D500 Iron deficiency anemia secondary to blood loss (chronic): Secondary | ICD-10-CM

## 2017-05-06 DIAGNOSIS — R748 Abnormal levels of other serum enzymes: Secondary | ICD-10-CM

## 2017-05-06 DIAGNOSIS — Z7189 Other specified counseling: Secondary | ICD-10-CM | POA: Insufficient documentation

## 2017-05-06 DIAGNOSIS — E538 Deficiency of other specified B group vitamins: Secondary | ICD-10-CM

## 2017-05-06 DIAGNOSIS — R319 Hematuria, unspecified: Secondary | ICD-10-CM | POA: Insufficient documentation

## 2017-05-06 NOTE — Progress Notes (Signed)
[facility] day:  05/06/2017  Chief Complaint: Carla Conley is a 57 y.o. female with anemia who is seen for review of work-up and initiation of IV iron and B12 injections.  HPI:   The patient was last seen in the hematology clinic on 05/04/2017 for initial consultation.  At that time, she was felt to have iron deficiency anemia due to poor diet and GI bleeding.  She noted a recurrent history of coffee ground emesis and hematochezia.  She denied hematuria or vaginal bleeding.  She was intolerant of oral iron.  Work-up revealed a hematocrit of 32.4, hemoglobin 9.9, MCV 74.7, platelets 255,00, WBC 7800 with an ANC of 5100.  Ferritin was 5.  Iron saturation was 4% with a TIBC of 544.  B12 was 151.  Folate was 27.0.  TSH was 3.192.  Free T4 was 0.77.  Urinalysis revealed a small amount of blood.  There were 6-30 RBC/HPF.  Potassium was 3.2.  Calcium was 8.5.  Alkaline phosphatase was 169 (38-126).  Symptomatically,   Past Medical History:  Diagnosis Date  . Acid reflux   . Allergy   . Anemia   . Anxiety   . Asthma   . COPD (chronic obstructive pulmonary disease) ([place])   . Depression   . Diarrhea   . Emphysema of lung ([place])   . Frequent headaches   . Head injury   . Osteoarthritis   . Paranoid disorder ([place])   . Pre-diabetes   . Psychiatric disorder   . Seasonal allergic rhinitis   . Sleep apnea   . Suicidal overdose [facility])     Past Surgical History:  Procedure Laterality Date  . COLONOSCOPY WITH PROPOFOL N/A 10/08/2016   Procedure: COLONOSCOPY WITH PROPOFOL;  Surgeon: Lollie Sails, MD;  Location: [facility] ENDOSCOPY;  Service: Endoscopy;  Laterality: N/A;  . ESOPHAGOGASTRODUODENOSCOPY (EGD) WITH PROPOFOL N/A 10/08/2016   Procedure: ESOPHAGOGASTRODUODENOSCOPY (EGD) WITH PROPOFOL;  Surgeon: Lollie Sails, MD;  Location: [facility] ENDOSCOPY;  Service: Endoscopy;  Laterality: N/A;  . NASAL SEPTUM SURGERY    . TONSILLECTOMY      Family  History  Problem Relation Age of Onset  . Cancer Mother     Social History:  reports that she has been smoking cigarettes.  She has been smoking about 0.50 packs per day. She has never used smokeless tobacco. She reports that she drinks about 1.2 oz of alcohol per week. She reports that she does not use drugs.  Patient smokes during the first part of the month ("rolls my own").  She drinks alcohol (2 glasses of wine occasionally). She is a former cocaine and marijuana user; quit 4 years ago. Patient lives alone in [place]. She "has no family and doesn't know anyone locally".  She is alone today.  Allergies:  Allergies  Allergen Reactions  . Olanzapine Shortness Of Breath    numbness Pt stated her body shuts down   . Quetiapine Anaphylaxis  . Seroquel [Quetiapine Fumarate] Anaphylaxis    Mulitple psychiatric drug reactions, but unable to list  . Celecoxib Other (See Comments)    GI bleed  . Citalopram Other (See Comments)    GI bleed  . Fluoxetine   . Ibuprofen   . Iron   . Meloxicam Other (See Comments)  . Imipramine Other (See Comments)    Gas pains    Current Medications: Current Outpatient Medications  Medication Sig Dispense Refill  . albuterol (PROVENTIL HFA;VENTOLIN HFA) 108 (90 Base)  MCG/ACT inhaler Inhale 2 puffs into the lungs every 6 (six) hours as needed for wheezing or shortness of breath. 1 Inhaler 1  . cetirizine (ZYRTEC) 10 MG tablet Take 1 tablet (10 mg total) by mouth daily. (Patient taking differently: Take 10 mg by mouth 2 (two) times daily. ) 30 tablet 2  . diazepam (VALIUM) 10 MG tablet Take 10 mg by mouth 3 (three) times daily. psych    . nystatin ointment (MYCOSTATIN) Apply 1 application topically as needed.    Marland Kitchen omeprazole (PRILOSEC) 20 MG capsule Take one capsule once daily. (Patient taking differently: Take 40 mg by mouth daily. Take one capsule once daily.) 21 capsule 0  . tiotropium (SPIRIVA HANDIHALER) 18 MCG inhalation capsule Place 1 capsule (18 mcg  total) into inhaler and inhale daily. 30 capsule 2  . tiZANidine (ZANAFLEX) 2 MG tablet Take by mouth every 6 (six) hours as needed for muscle spasms.     No current facility-administered medications for this visit.     Review of Systems:  GENERAL:  Fatigued.  No fevers.  "One minute freezing and one minute sweating to death".  Weight gain of 75 pounds in 3 months. PERFORMANCE STATUS (ECOG):  2 HEENT:  Allergies.  No visual changes, runny nose, sore throat, mouth sores or tenderness. Lungs:  Shortness of breath.  Sometimes cough productive of yellow sputum. No hemoptysis. Cardiac: Intermittent RIGHT chest pain due to "anxiety".  No palpitations, orthopnea, or PND. GI:  Nausea. Hematemesis. No diarrhea, constipation, melena or hematochezia. GU: No urgency, frequency, dysuria, or hematuria.  No menses in 5 years. Musculoskeletal:  "Bad knees, bad ankles, and bad back".  No muscle tenderness. Extremities:  No pain or swelling. Skin: Hair coming out.  Nails breaking.  No rashes or skin changes. Neuro: Vertigo symptoms. Often pre-syncopal. Daily headaches and generalized weakness. No numbness, balance or coordination issues. Endocrine: No diabetes, thyroid issues, hot flashes or night sweats. Psych:  Depression and anxiety. OCD.  h/o suicidal thoughts (none today); sees psychiatry.  Pain:  No focal pain. Review of systems:  All other systems reviewed and found to be negative.  Physical Exam: Last menstrual period 09/15/2012. GENERAL:  Well developed, well nourished, woman sitting comfortably in the exam room in no acute distress. MENTAL STATUS:  Alert and oriented to person, place and time. HEAD:  Long brown hair.  Normocephalic, atraumatic, face symmetric, no Cushingoid features. EYES:  Blue eyes.  Pupils equal round and reactive to light and accomodation.  No conjunctivitis or scleral icterus. ENT:  Oropharynx clear without lesion.  Tongue normal.  Mucous membranes moist.  RESPIRATORY:   Clear to auscultation without rales, wheezes or rhonchi. CARDIOVASCULAR:  Regular rate and rhythm without murmur, rub or gallop. ABDOMEN:  Soft, non-tender, with active bowel sounds, and no hepatosplenomegaly.  No masses. SKIN:  Scars on upper extremity.  No rashes, ulcers or lesions. EXTREMITIES: No edema, no skin discoloration or tenderness.  No palpable cords. LYMPH NODES: No palpable cervical, supraclavicular, axillary or inguinal adenopathy  NEUROLOGICAL: Unremarkable. PSYCH:  Appropriate.   Office Visit on 05/04/2017  Component Date Value Ref Range Status  . Vitamin B-12 05/04/2017 151* 180 - 914 pg/mL Final   Comment: (NOTE) This assay is not validated for testing neonatal or myeloproliferative syndrome specimens for Vitamin B12 levels. Performed at [facility], [place] [address] [place], [place] [address]   . TSH 05/04/2017 3.192  0.350 - 4.500 uIU/mL Final   Comment: Performed by a 3rd Generation  assay with a functional sensitivity of <=0.01 uIU/mL. Performed at [facility], [address], [place], [place] [address]   . Free T4 05/04/2017 0.77  0.61 - 1.12 ng/dL Final   Comment: (NOTE) Biotin ingestion may interfere with free T4 tests. If the results are inconsistent with the TSH level, previous test results, or the clinical presentation, then consider biotin interference. If needed, order repeat testing after stopping biotin. Performed at [facility], [address] [place], [place] [address]   . Folate 05/04/2017 27.0  >5.9 ng/mL Final   Performed at [facility], [place] [address] [place], [place] [address]  . Iron 05/04/2017 22* 28 - 170 ug/dL Final  . TIBC 05/04/2017 544* 250 - 450 ug/dL Final  . Saturation Ratios 05/04/2017 4* 10.4 - 31.8 % Final  . UIBC 05/04/2017 522  ug/dL Final   Performed at [facility], [address] [place], [place] [address]  . Ferritin 05/04/2017 5* 11 - 307 ng/mL Final   Performed at  [facility], [address] [place], [place] [address]  . Sodium 05/04/2017 137  135 - 145 mmol/L Final  . Potassium 05/04/2017 3.2* 3.5 - 5.1 mmol/L Final  . Chloride 05/04/2017 103  101 - 111 mmol/L Final  . CO2 05/04/2017 25  22 - 32 mmol/L Final  . Glucose, Bld 05/04/2017 102* 65 - 99 mg/dL Final  . BUN 05/04/2017 10  6 - 20 mg/dL Final  . Creatinine, Ser 05/04/2017 0.84  0.44 - 1.00 mg/dL Final  . Calcium 05/04/2017 8.5* 8.9 - 10.3 mg/dL Final  . Total Protein 05/04/2017 7.5  6.5 - 8.1 g/dL Final  . Albumin 05/04/2017 3.9  3.5 - 5.0 g/dL Final  . AST 05/04/2017 36  15 - 41 U/L Final  . ALT 05/04/2017 27  14 - 54 U/L Final  . Alkaline Phosphatase 05/04/2017 169* 38 - 126 U/L Final  . Total Bilirubin 05/04/2017 0.7  0.3 - 1.2 mg/dL Final  . GFR calc non Af Amer 05/04/2017 >60  >60 mL/min Final  . GFR calc Af Amer 05/04/2017 >60  >60 mL/min Final   Comment: (NOTE) The eGFR has been calculated using the CKD EPI equation. This calculation has not been validated in all clinical situations. eGFR's persistently <60 mL/min signify possible Chronic Kidney Disease.   Georgiann Hahn gap 05/04/2017 9  5 - 15 Final   Performed at [facility], [address] [place],  [address]  . WBC 05/04/2017 7.8  3.6 - 11.0 K/uL Final  . RBC 05/04/2017 4.34  3.80 - 5.20 MIL/uL Final  . Hemoglobin 05/04/2017 9.9* 12.0 - 16.0 g/dL Final  . HCT 05/04/2017 32.4* 35.0 - 47.0 % Final  . MCV 05/04/2017 74.7* 80.0 - 100.0 fL Final  . MCH 05/04/2017 22.9* 26.0 - 34.0 pg Final  . MCHC 05/04/2017 30.7* 32.0 - 36.0 g/dL Final  . RDW 05/04/2017 18.3* 11.5 - 14.5 % Final  . Platelets 05/04/2017 255  150 - 440 K/uL Final  . Neutrophils Relative % 05/04/2017 66  % Final  . Neutro Abs 05/04/2017 5.1  1.4 - 6.5 K/uL Final  . Lymphocytes Relative 05/04/2017 27  % Final  . Lymphs Abs 05/04/2017 2.1  1.0 - 3.6 K/uL Final  . Monocytes Relative 05/04/2017 5  % Final  . Monocytes Absolute  05/04/2017 0.4  0.2 - 0.9 K/uL Final  . Eosinophils Relative 05/04/2017 1  % Final  . Eosinophils Absolute 05/04/2017 0.1  0 - 0.7 K/uL Final  . Basophils Relative 05/04/2017 1  % Final  . Basophils Absolute 05/04/2017 0.1  0 - 0.1 K/uL Final   Performed at [facility], [address], [place], [place] [address]  . Color, Urine 05/04/2017 YELLOW  YELLOW Final  . APPearance 05/04/2017 CLEAR  CLEAR Final  . Specific Gravity, Urine 05/04/2017 1.020  1.005 - 1.030 Final  . pH 05/04/2017 6.0  5.0 - 8.0 Final  . Glucose, UA 05/04/2017 NEGATIVE  NEGATIVE mg/dL Final  . Hgb urine dipstick 05/04/2017 SMALL* NEGATIVE Final  . Bilirubin Urine 05/04/2017 NEGATIVE  NEGATIVE Final  . Ketones, ur 05/04/2017 NEGATIVE  NEGATIVE mg/dL Final  . Protein, ur 05/04/2017 NEGATIVE  NEGATIVE mg/dL Final  . Nitrite 05/04/2017 NEGATIVE  NEGATIVE Final  . Leukocytes, UA 05/04/2017 NEGATIVE  NEGATIVE Final  . Squamous Epithelial / LPF 05/04/2017 0-5* NONE SEEN Final  . WBC, UA 05/04/2017 NONE SEEN  0 - 5 WBC/hpf Final  . RBC / HPF 05/04/2017 6-30  0 - 5 RBC/hpf Final  . Bacteria, UA 05/04/2017 FEW* NONE SEEN Final   Performed at [facility], [address] [place], [place] [address]    Assessment:  Carla Conley is a 57 y.o. female with iron deficiency anemia due to poor diet and GI bleeding.  She notes a recurrent history of coffee ground emesis and hematochezia.  She denies hematuria or vaginal bleeding.  She is intolerant of oral iron.  Work-up on 05/04/2017 revealed a hematocrit of 32.4, hemoglobin 9.9, and MCV 74.7.  Ferritin was 5 with an iron saturation 4% and a TIBC of 544.  B12 was 151 (low).  Normal labs included: folate, TSH, and free T4.  Folate was 27.0.  Urinalysis revealed a small amount of blood.  There were 6-30 RBC/HPF.  Potassium was 3.2.  Calcium was 8.5.  Alkaline phosphatase was 169 (38-126).  Colonoscopy on 10/08/2016 revealed two 2-4 mm hyperplastic polyps in the  rectum, one 4 mm sessile serrated adenoma in the mid sigmoid colon, 1 mm polyp (lymphoid aggregate) at the appendiceal orifice, and one hyperplastic polyp in the rectum.  There was diverticulosis in the sigmoid and descending colon.  EGD on 10/08/2016 abnormal motility in the lower third of the esophagus with spasticity of the esophageal body and distal esophagus/lower esophageal sphincter with secondary peristaltic waves noted.  There was an irregular Z line, small hiatal hernia and mucosal variant in the duodenum.  Pathology revealed reflux changes in the gastric cardia, chronic nonspecific gastritis in the gastric antrum and body, and unremarkable duodenal mucosa.  She has psychiatric issues.  She has anxiety and depression.  She is followed by psychiatry at [facility].  Symptomatically, she is fatigued.  She has gained "75 pounds in 3 months".  Exam reveals no adenopathy or hepatosplenomegaly.  Plan: 1.  Review work-up.  Discuss iron deficiency.  Discuss treatment with IV iron weekly x 4.  Potential side effects reviewed.   2.  Discuss B12 deficiency.  Discuss B12 weekly x 6 then monthly. 3.  Discuss hematuria and referral to urology. 4.  Discuss other electrolyte issues (low potassium and calcium) and follow-up with PCP.  Discuss elevated alkaline phosphatase- recheck with isoenzymes. 5.  Begin weekly Venofer x 4. 6.  Begin B12 weekly x 6. 7.  RTC in 6 weeks for MD assessment, labs (CBC with diff, ferritin, intrinsic factor antibody, anti-parietal cell antibody, fractionated alkaline phosphatase).   Lequita Asal, MD  05/06/2017,  6:03 AM   I saw and evaluated the patient, participating in the key portions of the service and reviewing pertinent diagnostic studies and records.  I reviewed the nurse practitioner's note and agree with the findings and the plan.  The assessment and plan were discussed with the patient.  Multiple questions were asked by the patient and answered.   Nolon Stalls, MD 05/06/2017,6:03 AM

## 2017-05-09 ENCOUNTER — Other Ambulatory Visit: Payer: Self-pay | Admitting: Oncology

## 2017-05-10 ENCOUNTER — Encounter: Payer: Self-pay | Admitting: *Deleted

## 2017-05-10 ENCOUNTER — Inpatient Hospital Stay: Payer: Medicare HMO

## 2017-05-10 ENCOUNTER — Encounter: Payer: Self-pay | Admitting: Oncology

## 2017-05-10 ENCOUNTER — Inpatient Hospital Stay (HOSPITAL_BASED_OUTPATIENT_CLINIC_OR_DEPARTMENT_OTHER): Payer: Medicare HMO | Admitting: Oncology

## 2017-05-10 VITALS — BP 151/99 | HR 66 | Temp 97.4°F | Resp 20 | Wt 186.6 lb

## 2017-05-10 VITALS — BP 135/80 | HR 88

## 2017-05-10 DIAGNOSIS — D500 Iron deficiency anemia secondary to blood loss (chronic): Secondary | ICD-10-CM

## 2017-05-10 DIAGNOSIS — D509 Iron deficiency anemia, unspecified: Secondary | ICD-10-CM

## 2017-05-10 DIAGNOSIS — E538 Deficiency of other specified B group vitamins: Secondary | ICD-10-CM

## 2017-05-10 MED ORDER — SODIUM CHLORIDE 0.9 % IV SOLN
510.0000 mg | Freq: Once | INTRAVENOUS | Status: AC
  Administered 2017-05-10: 510 mg via INTRAVENOUS
  Filled 2017-05-10: qty 17

## 2017-05-10 MED ORDER — SODIUM CHLORIDE 0.9 % IV SOLN
Freq: Once | INTRAVENOUS | Status: AC
  Administered 2017-05-10: 14:00:00 via INTRAVENOUS
  Filled 2017-05-10: qty 1000

## 2017-05-10 NOTE — Progress Notes (Signed)
Patient here today for follow up regarding anemia, feraheme infusion.

## 2017-05-12 ENCOUNTER — Encounter: Payer: Self-pay | Admitting: Family Medicine

## 2017-05-13 NOTE — Progress Notes (Signed)
[facility]  [phone] [phone]  ID: [name] OB: November 11, 1960  MR#: [id number]  IHK#:[id number]  Patient Care Team: Gearldine Shown, DO as PCP - General (Family Medicine)  CHIEF COMPLAINT: Iron deficiency anemia.  INTERVAL HISTORY: Patient is a 57 year old female who was noted to have decreased hemoglobin and iron stores referred for further evaluation.  She is being seen today as a follow-up patient for a different provider.  She has multiple chronic medical complaints, but otherwise feels well.  She admits to weakness and fatigue.  She has no neurologic complaints.  She denies any recent fevers or illnesses.  She has no chest pain or shortness of breath.  She denies any nausea, vomiting, constipation, or diarrhea.  She has no melena or hematochezia.  She has no urinary complaints.  Patient offers no further specific complaints today.  REVIEW OF SYSTEMS:   Review of Systems  Constitutional: Positive for malaise/fatigue. Negative for fever and weight loss.  Respiratory: Negative.  Negative for cough, hemoptysis and shortness of breath.   Cardiovascular: Negative.  Negative for chest pain and leg swelling.  Gastrointestinal: Negative.  Negative for abdominal pain, blood in stool and melena.  Genitourinary: Negative.  Negative for hematuria.  Musculoskeletal: Negative.   Skin: Negative.  Negative for rash.  Neurological: Positive for weakness. Negative for sensory change and focal weakness.  Psychiatric/Behavioral: Positive for depression. Negative for substance abuse. The patient is nervous/anxious.     As per HPI. Otherwise, a complete review of systems is negative.  PAST MEDICAL HISTORY: Past Medical History:  Diagnosis Date  . Acid reflux   . Allergy   . Anemia   . Anxiety   . Asthma   . COPD (chronic obstructive pulmonary disease) ([place])   . Depression   . Diarrhea   . Emphysema of lung ([place])   . Frequent headaches     . Head injury   . Osteoarthritis   . Paranoid disorder ([place])   . Pre-diabetes   . Psychiatric disorder   . Seasonal allergic rhinitis   . Sleep apnea   . Suicidal overdose ([place])     PAST SURGICAL HISTORY: Past Surgical History:  Procedure Laterality Date  . COLONOSCOPY WITH PROPOFOL N/A 10/08/2016   Procedure: COLONOSCOPY WITH PROPOFOL;  Surgeon: Lollie Sails, MD;  Location: [facility] ENDOSCOPY;  Service: Endoscopy;  Laterality: N/A;  . ESOPHAGOGASTRODUODENOSCOPY (EGD) WITH PROPOFOL N/A 10/08/2016   Procedure: ESOPHAGOGASTRODUODENOSCOPY (EGD) WITH PROPOFOL;  Surgeon: Lollie Sails, MD;  Location: [facility] ENDOSCOPY;  Service: Endoscopy;  Laterality: N/A;  . NASAL SEPTUM SURGERY    . TONSILLECTOMY      FAMILY HISTORY: Family History  Problem Relation Age of Onset  . Cancer Mother     ADVANCED DIRECTIVES (Y/N):  N  HEALTH MAINTENANCE: Social History   Tobacco Use  . Smoking status: Current Every Day Smoker    Packs/day: 0.50    Types: Cigarettes  . Smokeless tobacco: Never Used  Substance Use Topics  . Alcohol use: Yes    Alcohol/week: 1.2 oz    Types: 2 Glasses of wine per week  . Drug use: No     Colonoscopy:  PAP:  Bone density:  Lipid panel:  Allergies  Allergen Reactions  . Olanzapine Shortness Of Breath    numbness Pt stated her body shuts down   . Quetiapine Anaphylaxis  . Seroquel [Quetiapine Fumarate] Anaphylaxis    Mulitple psychiatric drug reactions, but unable to list  . Celecoxib Other (  See Comments)    GI bleed  . Citalopram Other (See Comments)    GI bleed  . Fluoxetine   . Ibuprofen   . Iron   . Meloxicam Other (See Comments)  . Imipramine Other (See Comments)    Gas pains    Current Outpatient Medications  Medication Sig Dispense Refill  . albuterol (PROVENTIL HFA;VENTOLIN HFA) 108 (90 Base) MCG/ACT inhaler Inhale 2 puffs into the lungs every 6 (six) hours as needed for wheezing or shortness of breath. 1 Inhaler 1  . cetirizine  (ZYRTEC) 10 MG tablet Take 1 tablet (10 mg total) by mouth daily. (Patient taking differently: Take 10 mg by mouth 2 (two) times daily. ) 30 tablet 2  . diazepam (VALIUM) 10 MG tablet Take 10 mg by mouth 3 (three) times daily. psych    . nystatin ointment (MYCOSTATIN) Apply 1 application topically as needed.    Marland Kitchen omeprazole (PRILOSEC) 20 MG capsule Take one capsule once daily. (Patient taking differently: Take 40 mg by mouth daily. Take one capsule once daily.) 21 capsule 0  . tiotropium (SPIRIVA HANDIHALER) 18 MCG inhalation capsule Place 1 capsule (18 mcg total) into inhaler and inhale daily. 30 capsule 2  . tiZANidine (ZANAFLEX) 2 MG tablet Take by mouth every 6 (six) hours as needed for muscle spasms.     No current facility-administered medications for this visit.     OBJECTIVE: Vitals:   05/10/17 1352  BP: (!) 151/99  Pulse: 66  Resp: 20  Temp: (!) 97.4 F (36.3 C)     Body mass index is 30.12 kg/m.    ECOG FS:0 - Asymptomatic  General: Well-developed, well-nourished, no acute distress. Eyes: Pink conjunctiva, anicteric sclera. HEENT: Normocephalic, moist mucous membranes, clear oropharnyx. Lungs: Clear to auscultation bilaterally. Heart: Regular rate and rhythm. No rubs, murmurs, or gallops. Abdomen: Soft, nontender, nondistended. No organomegaly noted, normoactive bowel sounds. Musculoskeletal: No edema, cyanosis, or clubbing. Neuro: Alert, answering all questions appropriately. Cranial nerves grossly intact. Skin: No rashes or petechiae noted. Psych: Normal affect. Lymphatics: No cervical, calvicular, axillary or inguinal LAD.   LAB RESULTS:  Lab Results  Component Value Date   NA 137 05/04/2017   K 3.2 (L) 05/04/2017   CL 103 05/04/2017   CO2 25 05/04/2017   GLUCOSE 102 (H) 05/04/2017   BUN 10 05/04/2017   CREATININE 0.84 05/04/2017   CALCIUM 8.5 (L) 05/04/2017   PROT 7.5 05/04/2017   ALBUMIN 3.9 05/04/2017   AST 36 05/04/2017   ALT 27 05/04/2017   ALKPHOS  169 (H) 05/04/2017   BILITOT 0.7 05/04/2017   GFRNONAA >60 05/04/2017   GFRAA >60 05/04/2017    Lab Results  Component Value Date   WBC 7.8 05/04/2017   NEUTROABS 5.1 05/04/2017   HGB 9.9 (L) 05/04/2017   HCT 32.4 (L) 05/04/2017   MCV 74.7 (L) 05/04/2017   PLT 255 05/04/2017   Lab Results  Component Value Date   IRON 22 (L) 05/04/2017   TIBC 544 (H) 05/04/2017   IRONPCTSAT 4 (L) 05/04/2017   Lab Results  Component Value Date   FERRITIN 5 (L) 05/04/2017     STUDIES: No results found.  ASSESSMENT: Iron deficiency anemia.  PLAN:    1.  Iron deficiency anemia: Colonoscopy and EGD on October 08, 2016 revealed multiple colonic polyps, but no other abnormalities or obvious sites of bleeding.  Her iron deficiency anemia is possibly dietary related.  Previously, the remainder of her laboratory work was either negative or  within normal limits.  Proceed with 510 mg IV Feraheme today.  Patient will return to clinic in 1 week for second infusion.  She would then return to clinic in 2 months with repeat laboratory work, further evaluation, and consideration of additional IV iron. 2.  B12 deficiency: Likely dietary related.  Patient will benefit from B12 injections in the future.  Approximately 30 minutes spent in discussion of which greater than 50% was consultation.  Patient expressed understanding and was in agreement with this plan. She also understands that She can call clinic at any time with any questions, concerns, or complaints.    Lloyd Huger, MD   05/13/2017 8:38 AM

## 2017-05-17 ENCOUNTER — Inpatient Hospital Stay: Payer: Medicare HMO | Attending: Oncology

## 2017-05-17 VITALS — BP 114/78 | HR 74 | Resp 20

## 2017-05-17 DIAGNOSIS — D500 Iron deficiency anemia secondary to blood loss (chronic): Secondary | ICD-10-CM | POA: Diagnosis present

## 2017-05-17 MED ORDER — SODIUM CHLORIDE 0.9 % IV SOLN
510.0000 mg | Freq: Once | INTRAVENOUS | Status: AC
  Administered 2017-05-17: 510 mg via INTRAVENOUS
  Filled 2017-05-17: qty 17

## 2017-05-17 MED ORDER — CYANOCOBALAMIN 1000 MCG/ML IJ SOLN
1000.0000 ug | Freq: Once | INTRAMUSCULAR | Status: AC
  Administered 2017-05-17: 1000 ug via INTRAMUSCULAR
  Filled 2017-05-17: qty 1

## 2017-05-17 MED ORDER — SODIUM CHLORIDE 0.9 % IV SOLN
Freq: Once | INTRAVENOUS | Status: AC
  Administered 2017-05-17: 14:00:00 via INTRAVENOUS
  Filled 2017-05-17: qty 1000

## 2017-07-06 ENCOUNTER — Telehealth: Payer: Self-pay | Admitting: *Deleted

## 2017-07-06 NOTE — Telephone Encounter (Signed)
Attempted to call pt to follow up regarding housing needs and appointment for lab only visit. I spoke with Carla Conley and if patient can find a place to live the cancer center could possibly help with utilities for first month. I have also verified that with documentation of need for therapy animals she should not be turned away from housing due to having her 2 service dogs. It was recommended by Dr. Grayland Ormond that patient should come in for lab only on Thursday 5/23. I have been unable to reach patient to set up appointment at this time. Will continue to try and reach patient.

## 2017-07-09 DIAGNOSIS — F331 Major depressive disorder, recurrent, moderate: Secondary | ICD-10-CM | POA: Insufficient documentation

## 2017-07-16 NOTE — Progress Notes (Deleted)
[place]  Telephone:([phone]) [phone] [phone]) [phone]  ID: [name] OB: 12-21-1960  MR#: [id number]  UXL#:[id number]  Patient Care Team: Gearldine Shown, DO as PCP - General (Family Medicine)  CHIEF COMPLAINT: Iron deficiency anemia.  INTERVAL HISTORY: Patient is a 57 year old female who was noted to have decreased hemoglobin and iron stores referred for further evaluation.  She is being seen today as a follow-up patient for a different provider.  She has multiple chronic medical complaints, but otherwise feels well.  She admits to weakness and fatigue.  She has no neurologic complaints.  She denies any recent fevers or illnesses.  She has no chest pain or shortness of breath.  She denies any nausea, vomiting, constipation, or diarrhea.  She has no melena or hematochezia.  She has no urinary complaints.  Patient offers no further specific complaints today.  REVIEW OF SYSTEMS:   Review of Systems  Constitutional: Positive for malaise/fatigue. Negative for fever and weight loss.  Respiratory: Negative.  Negative for cough, hemoptysis and shortness of breath.   Cardiovascular: Negative.  Negative for chest pain and leg swelling.  Gastrointestinal: Negative.  Negative for abdominal pain, blood in stool and melena.  Genitourinary: Negative.  Negative for hematuria.  Musculoskeletal: Negative.   Skin: Negative.  Negative for rash.  Neurological: Positive for weakness. Negative for sensory change and focal weakness.  Psychiatric/Behavioral: Positive for depression. Negative for substance abuse. The patient is nervous/anxious.     As per HPI. Otherwise, a complete review of systems is negative.  PAST MEDICAL HISTORY: Past Medical History:  Diagnosis Date  . Acid reflux   . Allergy   . Anemia   . Anxiety   . Asthma   . COPD (chronic obstructive pulmonary disease) ([place])   . Depression   . Diarrhea   . Emphysema of lung ([place])   . Frequent headaches     . Head injury   . Osteoarthritis   . Paranoid disorder ([place])   . Pre-diabetes   . Psychiatric disorder   . Seasonal allergic rhinitis   . Sleep apnea   . Suicidal overdose ([place])     PAST SURGICAL HISTORY: Past Surgical History:  Procedure Laterality Date  . COLONOSCOPY WITH PROPOFOL N/A 10/08/2016   Procedure: COLONOSCOPY WITH PROPOFOL;  Surgeon: Lollie Sails, MD;  Location: [facility] ENDOSCOPY;  Service: Endoscopy;  Laterality: N/A;  . ESOPHAGOGASTRODUODENOSCOPY (EGD) WITH PROPOFOL N/A 10/08/2016   Procedure: ESOPHAGOGASTRODUODENOSCOPY (EGD) WITH PROPOFOL;  Surgeon: Lollie Sails, MD;  Location: [facility] ENDOSCOPY;  Service: Endoscopy;  Laterality: N/A;  . NASAL SEPTUM SURGERY    . TONSILLECTOMY      FAMILY HISTORY: Family History  Problem Relation Age of Onset  . Cancer Mother     ADVANCED DIRECTIVES (Y/N):  N  HEALTH MAINTENANCE: Social History   Tobacco Use  . Smoking status: Current Every Day Smoker    Packs/day: 0.50    Types: Cigarettes  . Smokeless tobacco: Never Used  Substance Use Topics  . Alcohol use: Yes    Alcohol/week: 1.2 oz    Types: 2 Glasses of wine per week  . Drug use: No     Colonoscopy:  PAP:  Bone density:  Lipid panel:  Allergies  Allergen Reactions  . Olanzapine Shortness Of Breath    numbness Pt stated her body shuts down   . Quetiapine Anaphylaxis  . Seroquel [Quetiapine Fumarate] Anaphylaxis    Mulitple psychiatric drug reactions, but unable to list  . Celecoxib Other (  See Comments)    GI bleed  . Citalopram Other (See Comments)    GI bleed  . Fluoxetine   . Ibuprofen   . Iron   . Meloxicam Other (See Comments)  . Imipramine Other (See Comments)    Gas pains    Current Outpatient Medications  Medication Sig Dispense Refill  . albuterol (PROVENTIL HFA;VENTOLIN HFA) 108 (90 Base) MCG/ACT inhaler Inhale 2 puffs into the lungs every 6 (six) hours as needed for wheezing or shortness of breath. 1 Inhaler 1  . cetirizine  (ZYRTEC) 10 MG tablet Take 1 tablet (10 mg total) by mouth daily. (Patient taking differently: Take 10 mg by mouth 2 (two) times daily. ) 30 tablet 2  . diazepam (VALIUM) 10 MG tablet Take 10 mg by mouth 3 (three) times daily. psych    . nystatin ointment (MYCOSTATIN) Apply 1 application topically as needed.    Marland Kitchen omeprazole (PRILOSEC) 20 MG capsule Take one capsule once daily. (Patient taking differently: Take 40 mg by mouth daily. Take one capsule once daily.) 21 capsule 0  . tiotropium (SPIRIVA HANDIHALER) 18 MCG inhalation capsule Place 1 capsule (18 mcg total) into inhaler and inhale daily. 30 capsule 2  . tiZANidine (ZANAFLEX) 2 MG tablet Take by mouth every 6 (six) hours as needed for muscle spasms.     No current facility-administered medications for this visit.     OBJECTIVE: There were no vitals filed for this visit.   There is no height or weight on file to calculate BMI.    ECOG FS:0 - Asymptomatic  General: Well-developed, well-nourished, no acute distress. Eyes: Pink conjunctiva, anicteric sclera. HEENT: Normocephalic, moist mucous membranes, clear oropharnyx. Lungs: Clear to auscultation bilaterally. Heart: Regular rate and rhythm. No rubs, murmurs, or gallops. Abdomen: Soft, nontender, nondistended. No organomegaly noted, normoactive bowel sounds. Musculoskeletal: No edema, cyanosis, or clubbing. Neuro: Alert, answering all questions appropriately. Cranial nerves grossly intact. Skin: No rashes or petechiae noted. Psych: Normal affect. Lymphatics: No cervical, calvicular, axillary or inguinal LAD.   LAB RESULTS:  Lab Results  Component Value Date   NA 137 05/04/2017   K 3.2 (L) 05/04/2017   CL 103 05/04/2017   CO2 25 05/04/2017   GLUCOSE 102 (H) 05/04/2017   BUN 10 05/04/2017   CREATININE 0.84 05/04/2017   CALCIUM 8.5 (L) 05/04/2017   PROT 7.5 05/04/2017   ALBUMIN 3.9 05/04/2017   AST 36 05/04/2017   ALT 27 05/04/2017   ALKPHOS 169 (H) 05/04/2017   BILITOT  0.7 05/04/2017   GFRNONAA >60 05/04/2017   GFRAA >60 05/04/2017    Lab Results  Component Value Date   WBC 7.8 05/04/2017   NEUTROABS 5.1 05/04/2017   HGB 9.9 (L) 05/04/2017   HCT 32.4 (L) 05/04/2017   MCV 74.7 (L) 05/04/2017   PLT 255 05/04/2017   Lab Results  Component Value Date   IRON 22 (L) 05/04/2017   TIBC 544 (H) 05/04/2017   IRONPCTSAT 4 (L) 05/04/2017   Lab Results  Component Value Date   FERRITIN 5 (L) 05/04/2017     STUDIES: No results found.  ASSESSMENT: Iron deficiency anemia.  PLAN:    1.  Iron deficiency anemia: Colonoscopy and EGD on October 08, 2016 revealed multiple colonic polyps, but no other abnormalities or obvious sites of bleeding.  Her iron deficiency anemia is possibly dietary related.  Previously, the remainder of her laboratory work was either negative or within normal limits.  Proceed with 510 mg IV Feraheme today.  Patient will return to clinic in 1 week for second infusion.  She would then return to clinic in 2 months with repeat laboratory work, further evaluation, and consideration of additional IV iron. 2.  B12 deficiency: Likely dietary related.  Patient will benefit from B12 injections in the future.  Approximately 30 minutes spent in discussion of which greater than 50% was consultation.  Patient expressed understanding and was in agreement with this plan. She also understands that She can call clinic at any time with any questions, concerns, or complaints.    Lloyd Huger, MD   07/16/2017 10:43 PM

## 2017-07-18 ENCOUNTER — Telehealth: Payer: Self-pay | Admitting: *Deleted

## 2017-07-18 NOTE — Telephone Encounter (Signed)
Already done

## 2017-07-18 NOTE — Telephone Encounter (Signed)
Ok, thank you for the update.  Please cancel all future appts.

## 2017-07-18 NOTE — Telephone Encounter (Signed)
Patient called and states she is transferring her care to [facility] as she is no longer living in [facility], she lost her home.

## 2017-07-19 ENCOUNTER — Other Ambulatory Visit: Payer: Medicare HMO

## 2017-07-19 ENCOUNTER — Ambulatory Visit: Payer: Medicare HMO | Admitting: Oncology

## 2017-07-19 ENCOUNTER — Ambulatory Visit: Payer: Medicare HMO

## 2017-07-25 ENCOUNTER — Emergency Department
Admission: EM | Admit: 2017-07-25 | Discharge: 2017-07-25 | Disposition: A | Discharge status: Home / Self Care | Payer: Medicare HMO | Attending: Student in an Organized Health Care Education/Training Program | Admitting: Student in an Organized Health Care Education/Training Program

## 2017-07-25 ENCOUNTER — Other Ambulatory Visit: Payer: Self-pay

## 2017-07-25 DIAGNOSIS — Z79899 Other long term (current) drug therapy: Secondary | ICD-10-CM | POA: Insufficient documentation

## 2017-07-25 DIAGNOSIS — J45909 Unspecified asthma, uncomplicated: Secondary | ICD-10-CM | POA: Insufficient documentation

## 2017-07-25 DIAGNOSIS — R1084 Generalized abdominal pain: Secondary | ICD-10-CM | POA: Diagnosis not present

## 2017-07-25 DIAGNOSIS — J449 Chronic obstructive pulmonary disease, unspecified: Secondary | ICD-10-CM | POA: Diagnosis not present

## 2017-07-25 DIAGNOSIS — G8929 Other chronic pain: Secondary | ICD-10-CM | POA: Diagnosis not present

## 2017-07-25 DIAGNOSIS — R195 Other fecal abnormalities: Secondary | ICD-10-CM | POA: Diagnosis present

## 2017-07-25 LAB — COMPREHENSIVE METABOLIC PANEL
ALT: 171 U/L — ABNORMAL HIGH (ref 14–54)
ANION GAP: 8 (ref 5–15)
AST: 141 U/L — ABNORMAL HIGH (ref 15–41)
Albumin: 3.7 g/dL (ref 3.5–5.0)
Alkaline Phosphatase: 133 U/L — ABNORMAL HIGH (ref 38–126)
BUN: 6 mg/dL (ref 6–20)
CO2: 27 mmol/L (ref 22–32)
Calcium: 8.5 mg/dL — ABNORMAL LOW (ref 8.9–10.3)
Chloride: 101 mmol/L (ref 101–111)
Creatinine, Ser: 0.87 mg/dL (ref 0.44–1.00)
Glucose, Bld: 119 mg/dL — ABNORMAL HIGH (ref 65–99)
POTASSIUM: 4.3 mmol/L (ref 3.5–5.1)
Sodium: 136 mmol/L (ref 135–145)
TOTAL PROTEIN: 6.9 g/dL (ref 6.5–8.1)
Total Bilirubin: 0.5 mg/dL (ref 0.3–1.2)

## 2017-07-25 LAB — CBC WITH DIFFERENTIAL/PLATELET
BASOS ABS: 0.1 10*3/uL (ref 0–0.1)
Basophils Relative: 1 %
EOS ABS: 0.1 10*3/uL (ref 0–0.7)
EOS PCT: 1 %
HCT: 38.6 % (ref 35.0–47.0)
Hemoglobin: 12.9 g/dL (ref 12.0–16.0)
LYMPHS ABS: 1.8 10*3/uL (ref 1.0–3.6)
Lymphocytes Relative: 23 %
MCH: 33.2 pg (ref 26.0–34.0)
MCHC: 33.4 g/dL (ref 32.0–36.0)
MCV: 99.4 fL (ref 80.0–100.0)
Monocytes Absolute: 0.5 10*3/uL (ref 0.2–0.9)
Monocytes Relative: 6 %
Neutro Abs: 5.5 10*3/uL (ref 1.4–6.5)
Neutrophils Relative %: 69 %
PLATELETS: 295 10*3/uL (ref 150–440)
RBC: 3.89 MIL/uL (ref 3.80–5.20)
RDW: 19.7 % — ABNORMAL HIGH (ref 11.5–14.5)
WBC: 7.9 10*3/uL (ref 3.6–11.0)

## 2017-07-25 LAB — TYPE AND SCREEN
ABO/RH(D): A POS
Antibody Screen: NEGATIVE

## 2017-07-25 NOTE — Discharge Instructions (Addendum)

## 2017-07-25 NOTE — ED Provider Notes (Signed)
[facility] Emergency Department Provider Note    First MD Initiated Contact with Patient 07/25/17 1505     (approximate)  I have reviewed the triage vital signs and the nursing notes.   HISTORY  Chief Complaint Blood In Stools and Hematemesis    HPI Carla Conley is a 57 y.o. female below listed past medical history presents to the ER with chief complaint of several months of coffee-ground emesis, black tarry stools, leg swelling, chronic abdominal pain.  Patient has not followed up with GI doctors because "she does not like [facility] [facility] ".  Patient states she does not want to follow back up at [facility] who she saw just recently.  Reportedly told him that she was moving to the area but states that she has since changed her mind and is actually staying here.  Does not want follow-up at [facility] because "they dropped me out of bed twice ".  Patient does not have any description of the events that brought her to the ER today other than that she wants him to figure out what is been going on with her but does not want to follow-up with the specialist she is been referred to.  She denies any active belly pain, chest pain, shortness of breath.    Past Medical History:  Diagnosis Date  . Acid reflux   . Allergy   . Anemia   . Anxiety   . Asthma   . COPD (chronic obstructive pulmonary disease) ([place])   . Depression   . Diarrhea   . Emphysema of lung ([place])   . Frequent headaches   . Head injury   . Osteoarthritis   . Paranoid disorder ([place])   . Pre-diabetes   . Psychiatric disorder   . Seasonal allergic rhinitis   . Sleep apnea   . Suicidal overdose ([place])    Family History  Problem Relation Age of Onset  . Cancer Mother    Past Surgical History:  Procedure Laterality Date  . COLONOSCOPY WITH PROPOFOL N/A 10/08/2016   Procedure: COLONOSCOPY WITH PROPOFOL;  Surgeon: Lollie Sails, MD;  Location[facility] ENDOSCOPY;  Service: Endoscopy;  Laterality: N/A;   . ESOPHAGOGASTRODUODENOSCOPY (EGD) WITH PROPOFOL N/A 10/08/2016   Procedure: ESOPHAGOGASTRODUODENOSCOPY (EGD) WITH PROPOFOL;  Surgeon: Lollie Sails, MD;  Location: [facility] ENDOSCOPY;  Service: Endoscopy;  Laterality: N/A;  . NASAL SEPTUM SURGERY    . TONSILLECTOMY     Patient Active Problem List   Diagnosis Date Noted  . B12 deficiency 05/06/2017  . Hematuria 05/06/2017  . Elevated alkaline phosphatase level 05/06/2017  . Goals of care, counseling/discussion 05/06/2017  . Iron deficiency anemia due to chronic blood loss 05/05/2017  . IBS (irritable bowel syndrome) 06/12/2015  . Urinary retention 06/12/2015  . Smoker 06/12/2015  . Abnormal Pap smear of cervix 03/23/2013  . Allergic rhinitis 08/03/2012  . Airway hyperreactivity 08/03/2012  . Bipolar affective disorder ([place]) 08/03/2012  . Borderline personality disorder ([place]) 08/03/2012  . Chronic obstructive pulmonary disease ([place]) 08/03/2012  . Acid reflux 08/03/2012      Prior to Admission medications   Medication Sig Start Date End Date Taking? Authorizing Provider  albuterol (PROVENTIL HFA;VENTOLIN HFA) 108 (90 Base) MCG/ACT inhaler Inhale 2 puffs into the lungs every 6 (six) hours as needed for wheezing or shortness of breath. 05/31/15   Paulina Fusi, MD  cetirizine (ZYRTEC) 10 MG tablet Take 1 tablet (10 mg total) by mouth daily. Patient taking differently: Take 10 mg by  mouth 2 (two) times daily.  06/12/15   Plonk, Gwyndolyn Saxon, MD  diazepam (VALIUM) 10 MG tablet Take 10 mg by mouth 3 (three) times daily. psych    [provider]  nystatin ointment (MYCOSTATIN) Apply 1 application topically as needed. 04/20/17 04/20/18  [provider]  omeprazole (PRILOSEC) 20 MG capsule Take one capsule once daily. Patient taking differently: Take 40 mg by mouth daily. Take one capsule once daily. 05/31/15   Paulina Fusi, MD  tiotropium (SPIRIVA HANDIHALER) 18 MCG inhalation capsule Place 1 capsule (18 mcg total) into inhaler  and inhale daily. 06/12/15   Plonk, Gwyndolyn Saxon, MD  tiZANidine (ZANAFLEX) 2 MG tablet Take by mouth every 6 (six) hours as needed for muscle spasms.    [provider]    Allergies Olanzapine; Quetiapine; Seroquel [quetiapine fumarate]; Celecoxib; Citalopram; Fluoxetine; Ibuprofen; Iron; Meloxicam; and Imipramine    Social History Social History   Tobacco Use  . Smoking status: Current Every Day Smoker    Packs/day: 1.00    Types: Cigarettes  . Smokeless tobacco: Never Used  Substance Use Topics  . Alcohol use: Yes    Alcohol/week: 4.8 oz    Types: 8 Glasses of wine per week    Comment: 2 large bottles of wine daily  . Drug use: No    Review of Systems Patient denies headaches, rhinorrhea, blurry vision, numbness, shortness of breath, chest pain, edema, cough, abdominal pain, nausea, vomiting, diarrhea, dysuria, fevers, rashes or hallucinations unless otherwise stated above in HPI. ____________________________________________   PHYSICAL EXAM:  VITAL SIGNS: Vitals:   07/25/17 1504 07/25/17 1505  BP:    Pulse: 80 79  Resp:    Temp:    SpO2: 97% 98%    Constitutional: Alert and oriented.  Eyes: Conjunctivae are normal.  Head: Atraumatic. Nose: No congestion/rhinnorhea. Mouth/Throat: Mucous membranes are moist.   Neck: No stridor. Painless ROM.  Cardiovascular: Normal rate, regular rhythm. Grossly normal heart sounds.  Good peripheral circulation. Respiratory: Normal respiratory effort.  No retractions. Lungs CTAB. Gastrointestinal: Soft and nontender. No distention. No abdominal bruits. No CVA tenderness. Genitourinary: deferred Musculoskeletal: No lower extremity tenderness nor edema.  No joint effusions. Neurologic:  Normal speech and language. No gross focal neurologic deficits are appreciated. No facial droop Skin:  Skin is warm, dry and intact. No rash noted. Psychiatric: Mood and affect are normal. Speech and behavior are  normal.  ____________________________________________   LABS (all labs ordered are listed, but only abnormal results are displayed)  Results for orders placed or performed during the hospital encounter of 07/25/17 (from the past 24 hour(s))  Comprehensive metabolic panel     Status: Abnormal   Collection Time: 07/25/17 11:18 AM  Result Value Ref Range   Sodium 136 135 - 145 mmol/L   Potassium 4.3 3.5 - 5.1 mmol/L   Chloride 101 101 - 111 mmol/L   CO2 27 22 - 32 mmol/L   Glucose, Bld 119 (H) 65 - 99 mg/dL   BUN 6 6 - 20 mg/dL   Creatinine, Ser 0.87 0.44 - 1.00 mg/dL   Calcium 8.5 (L) 8.9 - 10.3 mg/dL   Total Protein 6.9 6.5 - 8.1 g/dL   Albumin 3.7 3.5 - 5.0 g/dL   AST 141 (H) 15 - 41 U/L   ALT 171 (H) 14 - 54 U/L   Alkaline Phosphatase 133 (H) 38 - 126 U/L   Total Bilirubin 0.5 0.3 - 1.2 mg/dL   GFR calc non Af Amer >60 >60 mL/min  GFR calc Af Amer >60 >60 mL/min   Anion gap 8 5 - 15  Type and screen [place]     Status: None   Collection Time: 07/25/17 11:18 AM  Result Value Ref Range   ABO/RH(D) A POS    Antibody Screen NEG    Sample Expiration      07/28/2017 Performed at [facility], [address] [place], [place] [address]   CBC with Differential     Status: Abnormal   Collection Time: 07/25/17 11:18 AM  Result Value Ref Range   WBC 7.9 3.6 - 11.0 K/uL   RBC 3.89 3.80 - 5.20 MIL/uL   Hemoglobin 12.9 12.0 - 16.0 g/dL   HCT 38.6 35.0 - 47.0 %   MCV 99.4 80.0 - 100.0 fL   MCH 33.2 26.0 - 34.0 pg   MCHC 33.4 32.0 - 36.0 g/dL   RDW 19.7 (H) 11.5 - 14.5 %   Platelets 295 150 - 440 K/uL   Neutrophils Relative % 69 %   Neutro Abs 5.5 1.4 - 6.5 K/uL   Lymphocytes Relative 23 %   Lymphs Abs 1.8 1.0 - 3.6 K/uL   Monocytes Relative 6 %   Monocytes Absolute 0.5 0.2 - 0.9 K/uL   Eosinophils Relative 1 %   Eosinophils Absolute 0.1 0 - 0.7 K/uL   Basophils Relative 1 %   Basophils Absolute 0.1 0 - 0.1 K/uL    ______________________________________ ____________________________________________ ____________________________________________   PROCEDURES  Procedure(s) performed:  Procedures    Critical Care performed: no ____________________________________________   INITIAL IMPRESSION / ASSESSMENT AND PLAN / ED COURSE  Pertinent labs & imaging results that were available during my care of the patient were reviewed by me and considered in my medical decision making (see chart for details).   DDX: gastritis, hepatitis, pancreatitis, chf, nephrotic syndrome, aki  Ranata Laughery is a 58 y.o. who presents to the ED with symptoms as described above.  Patient well-appearing and in no acute distress.  Blood work sent for the above differential is stable and at baseline.  She is well-appearing is not having any evidence of active hematemesis or melena has been here for over 5hours.  Hemoglobin is stable as compared to previous.  Her abdominal exam is soft and benign.  Patient given referral to outpatient GI as this seems to be chronic medical condition and patient was instructed and informed about signs and symptoms for which she should seek emergent medical attention.  Patient was able to tolerate PO and was able to ambulate with a steady gait.       As part of my medical decision making, I reviewed the following data within the [facility] notes reviewed and incorporated, Labs reviewed, notes from prior ED visits.   ____________________________________________   FINAL CLINICAL IMPRESSION(S) / ED DIAGNOSES  Final diagnoses:  Generalized abdominal pain      NEW MEDICATIONS STARTED DURING THIS VISIT:  New Prescriptions   No medications on file     Note:  This document was prepared using Dragon voice recognition software and may include unintentional dictation errors.    Merlyn Lot, MD 07/25/17 [phone]

## 2017-07-25 NOTE — ED Triage Notes (Signed)
Pt provider at the cancer center told her to "come in a few weeks ago" - she was getting FE infusions but missed her appt on the 4th - [facility] has also been telling her to come in for months - she is now having black stools and vomiting "coffee grounds" for over a year per pt report - c/o generalized cramping and bilat leg swelling - She reports also going to [facility] (suicidal) and [facility] (told lliver/heart/kidney failure and sent her home) in the last few weeks

## 2017-07-25 NOTE — ED Notes (Signed)
Pt left without discharge paperwork after dr Quentin Cornwall saw pt. See his note.

## 2017-07-25 NOTE — ED Notes (Signed)
First Nurse Note:  Patient complaining of "black stools and black vomiting" ever since she had a upper and lower endoscopy last fall.  Hx of 2 iron infusions.  Alert and oriented.  NAD.

## 2017-07-25 NOTE — ED Notes (Signed)
Pt in recliner in sub-wait with warm blanket. Pt sleeping. Equal rise and fall of chest noted. No distress noted.

## 2017-08-17 ENCOUNTER — Other Ambulatory Visit: Payer: Self-pay | Admitting: Student

## 2017-08-17 DIAGNOSIS — D509 Iron deficiency anemia, unspecified: Secondary | ICD-10-CM

## 2017-08-17 DIAGNOSIS — G8929 Other chronic pain: Secondary | ICD-10-CM

## 2017-08-17 DIAGNOSIS — R635 Abnormal weight gain: Secondary | ICD-10-CM

## 2017-08-17 DIAGNOSIS — K921 Melena: Secondary | ICD-10-CM

## 2017-08-17 DIAGNOSIS — R112 Nausea with vomiting, unspecified: Secondary | ICD-10-CM

## 2017-08-17 DIAGNOSIS — R109 Unspecified abdominal pain: Secondary | ICD-10-CM

## 2017-08-26 ENCOUNTER — Ambulatory Visit
Admission: RE | Admit: 2017-08-26 | Discharge: 2017-08-26 | Disposition: A | Discharge status: Home / Self Care | Payer: Medicare HMO | Source: Ambulatory Visit | Attending: Student | Admitting: Student

## 2017-08-26 DIAGNOSIS — D735 Infarction of spleen: Secondary | ICD-10-CM | POA: Insufficient documentation

## 2017-08-26 DIAGNOSIS — R112 Nausea with vomiting, unspecified: Secondary | ICD-10-CM

## 2017-08-26 DIAGNOSIS — R109 Unspecified abdominal pain: Secondary | ICD-10-CM | POA: Insufficient documentation

## 2017-08-26 DIAGNOSIS — K449 Diaphragmatic hernia without obstruction or gangrene: Secondary | ICD-10-CM | POA: Diagnosis not present

## 2017-08-26 DIAGNOSIS — K5730 Diverticulosis of large intestine without perforation or abscess without bleeding: Secondary | ICD-10-CM | POA: Diagnosis not present

## 2017-08-26 DIAGNOSIS — G8929 Other chronic pain: Secondary | ICD-10-CM | POA: Diagnosis not present

## 2017-08-26 DIAGNOSIS — K760 Fatty (change of) liver, not elsewhere classified: Secondary | ICD-10-CM | POA: Insufficient documentation

## 2017-08-26 DIAGNOSIS — I700 Atherosclerosis of aorta: Secondary | ICD-10-CM | POA: Insufficient documentation

## 2017-08-26 DIAGNOSIS — K921 Melena: Secondary | ICD-10-CM

## 2017-08-26 DIAGNOSIS — R635 Abnormal weight gain: Secondary | ICD-10-CM

## 2017-08-26 DIAGNOSIS — D509 Iron deficiency anemia, unspecified: Secondary | ICD-10-CM | POA: Diagnosis not present

## 2017-08-26 HISTORY — DX: Systemic involvement of connective tissue, unspecified: M35.9

## 2017-08-26 LAB — POCT I-STAT CREATININE: Creatinine, Ser: 0.9 mg/dL (ref 0.44–1.00)

## 2017-08-26 MED ORDER — IOPAMIDOL (ISOVUE-300) INJECTION 61%
100.0000 mL | Freq: Once | INTRAVENOUS | Status: AC | PRN
  Administered 2017-08-26: 100 mL via INTRAVENOUS

## 2017-09-21 ENCOUNTER — Encounter: Admission: RE | Payer: Self-pay | Source: Ambulatory Visit

## 2017-09-21 ENCOUNTER — Ambulatory Visit
Admission: RE | Admit: 2017-09-21 | Payer: Medicare HMO | Source: Ambulatory Visit | Admitting: Unknown Physician Specialty

## 2017-09-21 SURGERY — ESOPHAGOGASTRODUODENOSCOPY (EGD) WITH PROPOFOL
Anesthesia: General

## 2017-09-26 NOTE — Progress Notes (Signed)
[place]  Telephone:(336) [phone] Fax:(336) [phone]  ID: Carla Conley OB: 08-30-1960  MR#: [id number]  ZCH#:[id number]  Patient Care Team: Gearldine Shown, DO as PCP - General (Family Medicine)  CHIEF COMPLAINT: Iron deficiency anemia.  INTERVAL HISTORY: Patient returns to clinic today for repeat laboratory work and further evaluation.  She has not been seen since March 2019.  Patient transferred all of her care to [facility] temporarily after a suicide attempt.  She feels improved and is no longer suicidal and has no plans to harm herself or others.  She continues to have chronic weakness and fatigue.  She has no neurologic complaints.  She denies any recent fevers or illnesses.  She has no chest pain or shortness of breath.  She denies any nausea, vomiting, constipation, or diarrhea.  She has no melena or hematochezia.  She has no urinary complaints.  Patient offers no further specific complaints today.  REVIEW OF SYSTEMS:   Review of Systems  Constitutional: Positive for malaise/fatigue. Negative for fever and weight loss.  Respiratory: Negative.  Negative for cough, hemoptysis and shortness of breath.   Cardiovascular: Negative.  Negative for chest pain and leg swelling.  Gastrointestinal: Negative.  Negative for abdominal pain, blood in stool and melena.  Genitourinary: Negative.  Negative for hematuria.  Musculoskeletal: Negative.  Negative for back pain.  Skin: Negative.  Negative for rash.  Neurological: Positive for weakness. Negative for sensory change and focal weakness.  Psychiatric/Behavioral: Positive for depression. Negative for substance abuse and suicidal ideas. The patient is not nervous/anxious.     As per HPI. Otherwise, a complete review of systems is negative.  PAST MEDICAL HISTORY: Past Medical History:  Diagnosis Date  . Acid reflux   . Allergy   . Anemia   . Anxiety   . Asthma   . Collagen vascular disease ([place])   . COPD  (chronic obstructive pulmonary disease) ([place])   . Depression   . Diarrhea   . Emphysema of lung ([place])   . Frequent headaches   . Head injury   . Osteoarthritis   . Paranoid disorder ([place])   . Pre-diabetes   . Psychiatric disorder   . Seasonal allergic rhinitis   . Sleep apnea   . Suicidal overdose ([place])     PAST SURGICAL HISTORY: Past Surgical History:  Procedure Laterality Date  . COLONOSCOPY WITH PROPOFOL N/A 10/08/2016   Procedure: COLONOSCOPY WITH PROPOFOL;  Surgeon: Lollie Sails, MD;  Location: [facility] ENDOSCOPY;  Service: Endoscopy;  Laterality: N/A;  . ESOPHAGOGASTRODUODENOSCOPY (EGD) WITH PROPOFOL N/A 10/08/2016   Procedure: ESOPHAGOGASTRODUODENOSCOPY (EGD) WITH PROPOFOL;  Surgeon: Lollie Sails, MD;  Location: [facility] ENDOSCOPY;  Service: Endoscopy;  Laterality: N/A;  . NASAL SEPTUM SURGERY    . TONSILLECTOMY      FAMILY HISTORY: Family History  Problem Relation Age of Onset  . Cancer Mother     ADVANCED DIRECTIVES (Y/N):  N  HEALTH MAINTENANCE: Social History   Tobacco Use  . Smoking status: Current Every Day Smoker    Packs/day: 1.00    Types: Cigarettes  . Smokeless tobacco: Never Used  Substance Use Topics  . Alcohol use: Yes    Alcohol/week: 8.0 standard drinks    Types: 8 Glasses of wine per week    Comment: 2 large bottles of wine daily  . Drug use: No     Colonoscopy:  PAP:  Bone density:  Lipid panel:  Allergies  Allergen Reactions  . Olanzapine Shortness Of Breath  numbness Pt stated her body shuts down   . Quetiapine Anaphylaxis  . Seroquel [Quetiapine Fumarate] Anaphylaxis    Mulitple psychiatric drug reactions, but unable to list  . Celecoxib Other (See Comments)    GI bleed  . Citalopram Other (See Comments)    GI bleed  . Fluoxetine   . Ibuprofen   . Iron   . Meloxicam Other (See Comments)  . Imipramine Other (See Comments)    Gas pains    Current Outpatient Medications  Medication Sig Dispense Refill  .  albuterol (PROVENTIL HFA;VENTOLIN HFA) 108 (90 Base) MCG/ACT inhaler Inhale 2 puffs into the lungs every 6 (six) hours as needed for wheezing or shortness of breath. 1 Inhaler 1  . cetirizine (ZYRTEC) 10 MG tablet Take 1 tablet (10 mg total) by mouth daily. (Patient taking differently: Take 10 mg by mouth 2 (two) times daily. ) 30 tablet 2  . diazepam (VALIUM) 10 MG tablet Take 10 mg by mouth 3 (three) times daily. psych    . nystatin ointment (MYCOSTATIN) Apply 1 application topically as needed.    Marland Kitchen omeprazole (PRILOSEC) 20 MG capsule Take one capsule once daily. (Patient taking differently: Take 40 mg by mouth daily. Take one capsule once daily.) 21 capsule 0  . tiotropium (SPIRIVA HANDIHALER) 18 MCG inhalation capsule Place 1 capsule (18 mcg total) into inhaler and inhale daily. 30 capsule 2  . tiZANidine (ZANAFLEX) 2 MG tablet Take by mouth every 6 (six) hours as needed for muscle spasms.     No current facility-administered medications for this visit.     OBJECTIVE: Vitals:   09/29/17 1020 09/29/17 1026  BP:  135/84  Pulse:  94  Resp: 16   Temp:  (!) 97.3 F (36.3 C)     Body mass index is 32.83 kg/m.    ECOG FS:0 - Asymptomatic  General: Well-developed, well-nourished, no acute distress. Eyes: Pink conjunctiva, anicteric sclera. HEENT: Normocephalic, moist mucous membranes. Lungs: Clear to auscultation bilaterally. Heart: Regular rate and rhythm. No rubs, murmurs, or gallops. Abdomen: Soft, nontender, nondistended. No organomegaly noted, normoactive bowel sounds. Musculoskeletal: No edema, cyanosis, or clubbing. Neuro: Alert, answering all questions appropriately. Cranial nerves grossly intact. Skin: No rashes or petechiae noted. Psych: Normal affect.  LAB RESULTS:  Lab Results  Component Value Date   NA 136 07/25/2017   K 4.3 07/25/2017   CL 101 07/25/2017   CO2 27 07/25/2017   GLUCOSE 119 (H) 07/25/2017   BUN 6 07/25/2017   CREATININE 0.90 08/26/2017   CALCIUM 8.5  (L) 07/25/2017   PROT 6.9 07/25/2017   ALBUMIN 3.7 07/25/2017   AST 141 (H) 07/25/2017   ALT 171 (H) 07/25/2017   ALKPHOS 133 (H) 07/25/2017   BILITOT 0.5 07/25/2017   GFRNONAA >60 07/25/2017   GFRAA >60 07/25/2017    Lab Results  Component Value Date   WBC 5.8 09/29/2017   NEUTROABS 3.3 09/29/2017   HGB 13.1 09/29/2017   HCT 40.8 09/29/2017   MCV 92.4 09/29/2017   PLT 222 09/29/2017   Lab Results  Component Value Date   IRON 43 09/29/2017   TIBC 452 (H) 09/29/2017   IRONPCTSAT 10 (L) 09/29/2017   Lab Results  Component Value Date   FERRITIN 9 (L) 09/29/2017     STUDIES: No results found.  ASSESSMENT: Iron deficiency anemia.  PLAN:    1.  Iron deficiency anemia: Colonoscopy and EGD on October 08, 2016 revealed multiple colonic polyps, but no other abnormalities or obvious  sites of bleeding.  Patient's hemoglobin is now within normal limits, although her iron stores remain mildly decreased.  Previously, the remainder of her laboratory work was either negative or within normal limits.  She does not require additional IV Feraheme today, but will likely need an infusion at her next clinic visit.  She last received treatment on May 17, 2017.  It is unclear if she received any treatments while at [facility].  Return to clinic in 4 months with repeat laboratory and further evaluation. 2.  B12 deficiency: Likely dietary related.  Patient received a B12 injection on May 17, 2017. 3.  Suicidal ideation: Patient denies this today and states she has no plans or intent to harm herself or others.   Patient expressed understanding and was in agreement with this plan. She also understands that She can call clinic at any time with any questions, concerns, or complaints.    Lloyd Huger, MD   10/03/2017 12:37 PM

## 2017-09-28 ENCOUNTER — Other Ambulatory Visit: Payer: Self-pay | Admitting: *Deleted

## 2017-09-28 DIAGNOSIS — D509 Iron deficiency anemia, unspecified: Secondary | ICD-10-CM

## 2017-09-29 ENCOUNTER — Inpatient Hospital Stay: Payer: Medicare HMO

## 2017-09-29 ENCOUNTER — Inpatient Hospital Stay: Payer: Medicare HMO | Attending: Oncology

## 2017-09-29 ENCOUNTER — Inpatient Hospital Stay (HOSPITAL_BASED_OUTPATIENT_CLINIC_OR_DEPARTMENT_OTHER): Payer: Medicare HMO | Admitting: Oncology

## 2017-09-29 ENCOUNTER — Encounter: Payer: Self-pay | Admitting: Oncology

## 2017-09-29 ENCOUNTER — Encounter (INDEPENDENT_AMBULATORY_CARE_PROVIDER_SITE_OTHER): Payer: Self-pay

## 2017-09-29 ENCOUNTER — Other Ambulatory Visit: Payer: Self-pay

## 2017-09-29 ENCOUNTER — Ambulatory Visit: Payer: Medicare HMO | Admitting: Oncology

## 2017-09-29 ENCOUNTER — Other Ambulatory Visit: Payer: Medicare HMO

## 2017-09-29 ENCOUNTER — Ambulatory Visit: Payer: Medicare HMO

## 2017-09-29 VITALS — BP 135/84 | HR 94 | Temp 97.3°F | Resp 16 | Ht 66.0 in | Wt 203.4 lb

## 2017-09-29 DIAGNOSIS — E538 Deficiency of other specified B group vitamins: Secondary | ICD-10-CM | POA: Insufficient documentation

## 2017-09-29 DIAGNOSIS — F329 Major depressive disorder, single episode, unspecified: Secondary | ICD-10-CM | POA: Diagnosis not present

## 2017-09-29 DIAGNOSIS — R531 Weakness: Secondary | ICD-10-CM

## 2017-09-29 DIAGNOSIS — R5383 Other fatigue: Secondary | ICD-10-CM | POA: Diagnosis not present

## 2017-09-29 DIAGNOSIS — D509 Iron deficiency anemia, unspecified: Secondary | ICD-10-CM | POA: Insufficient documentation

## 2017-09-29 DIAGNOSIS — Z79899 Other long term (current) drug therapy: Secondary | ICD-10-CM

## 2017-09-29 DIAGNOSIS — F17210 Nicotine dependence, cigarettes, uncomplicated: Secondary | ICD-10-CM | POA: Diagnosis not present

## 2017-09-29 DIAGNOSIS — D500 Iron deficiency anemia secondary to blood loss (chronic): Secondary | ICD-10-CM

## 2017-09-29 LAB — CBC WITH DIFFERENTIAL/PLATELET
Basophils Absolute: 0 10*3/uL (ref 0–0.1)
Basophils Relative: 1 %
Eosinophils Absolute: 0.1 10*3/uL (ref 0–0.7)
Eosinophils Relative: 2 %
HEMATOCRIT: 40.8 % (ref 35.0–47.0)
Hemoglobin: 13.1 g/dL (ref 12.0–16.0)
Lymphocytes Relative: 35 %
Lymphs Abs: 2 10*3/uL (ref 1.0–3.6)
MCH: 29.7 pg (ref 26.0–34.0)
MCHC: 32.1 g/dL (ref 32.0–36.0)
MCV: 92.4 fL (ref 80.0–100.0)
MONO ABS: 0.3 10*3/uL (ref 0.2–0.9)
MONOS PCT: 6 %
Neutro Abs: 3.3 10*3/uL (ref 1.4–6.5)
Neutrophils Relative %: 56 %
Platelets: 222 10*3/uL (ref 150–440)
RBC: 4.42 MIL/uL (ref 3.80–5.20)
RDW: 16.8 % — AB (ref 11.5–14.5)
WBC: 5.8 10*3/uL (ref 3.6–11.0)

## 2017-09-29 LAB — IRON AND TIBC
Iron: 43 ug/dL (ref 28–170)
Saturation Ratios: 10 % — ABNORMAL LOW (ref 10.4–31.8)
TIBC: 452 ug/dL — ABNORMAL HIGH (ref 250–450)
UIBC: 409 ug/dL

## 2017-09-29 LAB — FERRITIN: Ferritin: 9 ng/mL — ABNORMAL LOW (ref 11–307)

## 2017-09-29 NOTE — Progress Notes (Signed)
Patient here for follow up. She has been having bouts of diverticulitis.

## 2017-11-09 ENCOUNTER — Ambulatory Visit
Admission: RE | Admit: 2017-11-09 | Payer: Medicare HMO | Source: Ambulatory Visit | Admitting: Unknown Physician Specialty

## 2017-11-09 ENCOUNTER — Encounter: Admission: RE | Payer: Self-pay | Source: Ambulatory Visit

## 2017-11-09 SURGERY — EGD (ESOPHAGOGASTRODUODENOSCOPY)
Anesthesia: General

## 2017-11-15 ENCOUNTER — Other Ambulatory Visit: Payer: Self-pay

## 2017-11-15 ENCOUNTER — Emergency Department
Admission: EM | Admit: 2017-11-15 | Discharge: 2017-11-15 | Disposition: A | Discharge status: Home / Self Care | Payer: Medicare HMO | Attending: Emergency Medicine | Admitting: Emergency Medicine

## 2017-11-15 DIAGNOSIS — Z5321 Procedure and treatment not carried out due to patient leaving prior to being seen by health care provider: Secondary | ICD-10-CM | POA: Insufficient documentation

## 2017-11-15 DIAGNOSIS — M7918 Myalgia, other site: Secondary | ICD-10-CM | POA: Insufficient documentation

## 2017-11-15 LAB — COMPREHENSIVE METABOLIC PANEL
ALBUMIN: 3.6 g/dL (ref 3.5–5.0)
ALK PHOS: 104 U/L (ref 38–126)
ALT: 46 U/L — ABNORMAL HIGH (ref 0–44)
ANION GAP: 9 (ref 5–15)
AST: 41 U/L (ref 15–41)
BILIRUBIN TOTAL: 0.4 mg/dL (ref 0.3–1.2)
BUN: 7 mg/dL (ref 6–20)
CALCIUM: 8.5 mg/dL — AB (ref 8.9–10.3)
CO2: 27 mmol/L (ref 22–32)
Chloride: 104 mmol/L (ref 98–111)
Creatinine, Ser: 0.98 mg/dL (ref 0.44–1.00)
GFR calc Af Amer: >60 mL/min (ref >60)
GFR calc non Af Amer: >60 mL/min (ref >60)
Glucose, Bld: 139 mg/dL — ABNORMAL HIGH (ref 70–99)
Potassium: 4.4 mmol/L (ref 3.5–5.1)
Sodium: 140 mmol/L (ref 135–145)
TOTAL PROTEIN: 6.7 g/dL (ref 6.5–8.1)

## 2017-11-15 LAB — CBC
HEMATOCRIT: 36.6 % (ref 35.0–47.0)
HEMOGLOBIN: 12 g/dL (ref 12.0–16.0)
MCH: 29 pg (ref 26.0–34.0)
MCHC: 32.9 g/dL (ref 32.0–36.0)
MCV: 88.1 fL (ref 80.0–100.0)
Platelets: 270 10*3/uL (ref 150–440)
RBC: 4.15 MIL/uL (ref 3.80–5.20)
RDW: 17.2 % — ABNORMAL HIGH (ref 11.5–14.5)
WBC: 5.3 10*3/uL (ref 3.6–11.0)

## 2017-11-15 LAB — LIPASE, BLOOD: LIPASE: 40 U/L (ref 11–51)

## 2017-11-15 NOTE — ED Triage Notes (Signed)
Pt in with co pain from axilla down to hips bilat that radiates to back. Denies any abd pain, states had blood drawn today due to black stools. Pain started today, no hx of the same. Has been having black stools for over a year.

## 2017-11-15 NOTE — ED Notes (Signed)
Symptoms discussed with Dr. Clearnce Hasten and protocols ordered as instructed.

## 2017-11-15 NOTE — ED Notes (Addendum)
Pt to front desk informing that she is leaving. Pt encouraged to stay but refusing.

## 2017-11-16 ENCOUNTER — Other Ambulatory Visit: Payer: Self-pay

## 2017-11-16 ENCOUNTER — Ambulatory Visit
Admission: EM | Admit: 2017-11-16 | Discharge: 2017-11-16 | Disposition: A | Discharge status: Home / Self Care | Payer: Medicare HMO | Attending: Family Medicine | Admitting: Family Medicine

## 2017-11-16 ENCOUNTER — Ambulatory Visit (INDEPENDENT_AMBULATORY_CARE_PROVIDER_SITE_OTHER): Payer: Medicare HMO

## 2017-11-16 DIAGNOSIS — S20212A Contusion of left front wall of thorax, initial encounter: Secondary | ICD-10-CM

## 2017-11-16 DIAGNOSIS — S39012A Strain of muscle, fascia and tendon of lower back, initial encounter: Secondary | ICD-10-CM | POA: Diagnosis not present

## 2017-11-16 DIAGNOSIS — W01190A Fall on same level from slipping, tripping and stumbling with subsequent striking against furniture, initial encounter: Secondary | ICD-10-CM

## 2017-11-16 DIAGNOSIS — L309 Dermatitis, unspecified: Secondary | ICD-10-CM

## 2017-11-16 NOTE — ED Triage Notes (Signed)
Patient complains of back pain that starts at axilla down to hip and all through spine x yesterday afternoon. Patient states that she went to ED last night but did not stay due to wait time.

## 2017-11-16 NOTE — ED Provider Notes (Signed)
[facility]    CSN: [id number] Arrival date & time: 11/16/17  1021     History   Chief Complaint Chief Complaint  Patient presents with  . Back Pain    HPI Carla Conley is a 57 y.o. female.   57 yo female with a c/o left sided rib and low back pain after falling at home yesterday. States she had woken up from a nap, was walking and tripped, hitting her ribs and back against a cabinet.   Also c/o a itchy rash patch on her right elbow.   The history is provided by the patient.  Back Pain    Past Medical History:  Diagnosis Date  . Acid reflux   . Allergy   . Anemia   . Anxiety   . Asthma   . Collagen vascular disease ([place])   . COPD (chronic obstructive pulmonary disease) ([place])   . Depression   . Diarrhea   . Emphysema of lung ([place])   . Frequent headaches   . Head injury   . Osteoarthritis   . Paranoid disorder ([place])   . Pre-diabetes   . Psychiatric disorder   . Seasonal allergic rhinitis   . Sleep apnea   . Suicidal overdose [facility])     Patient Active Problem List   Diagnosis Date Noted  . B12 deficiency 05/06/2017  . Hematuria 05/06/2017  . Elevated alkaline phosphatase level 05/06/2017  . Goals of care, counseling/discussion 05/06/2017  . Iron deficiency anemia due to chronic blood loss 05/05/2017  . IBS (irritable bowel syndrome) 06/12/2015  . Urinary retention 06/12/2015  . Smoker 06/12/2015  . Abnormal Pap smear of cervix 03/23/2013  . Allergic rhinitis 08/03/2012  . Airway hyperreactivity 08/03/2012  . Bipolar affective disorder ([place]) 08/03/2012  . Borderline personality disorder ([place]) 08/03/2012  . Chronic obstructive pulmonary disease ([place]) 08/03/2012  . Acid reflux 08/03/2012    Past Surgical History:  Procedure Laterality Date  . COLONOSCOPY WITH PROPOFOL N/A 10/08/2016   Procedure: COLONOSCOPY WITH PROPOFOL;  Surgeon: Lollie Sails, MD;  Location[facility] ENDOSCOPY;  Service: Endoscopy;  Laterality: N/A;  .  ESOPHAGOGASTRODUODENOSCOPY (EGD) WITH PROPOFOL N/A 10/08/2016   Procedure: ESOPHAGOGASTRODUODENOSCOPY (EGD) WITH PROPOFOL;  Surgeon: Lollie Sails, MD;  Location: [facility] ENDOSCOPY;  Service: Endoscopy;  Laterality: N/A;  . NASAL SEPTUM SURGERY    . TONSILLECTOMY      OB History   None      Home Medications    Prior to Admission medications   Medication Sig Start Date End Date Taking? Authorizing Provider  albuterol (PROVENTIL HFA;VENTOLIN HFA) 108 (90 Base) MCG/ACT inhaler Inhale 2 puffs into the lungs every 6 (six) hours as needed for wheezing or shortness of breath. 05/31/15  Yes Paulina Fusi, MD  cetirizine (ZYRTEC) 10 MG tablet Take 1 tablet (10 mg total) by mouth daily. Patient taking differently: Take 10 mg by mouth 2 (two) times daily.  06/12/15  Yes Plonk, Gwyndolyn Saxon, MD  diazepam (VALIUM) 10 MG tablet Take 10 mg by mouth 3 (three) times daily. psych   Yes [provider]  nystatin ointment (MYCOSTATIN) Apply 1 application topically as needed. 04/20/17 04/20/18 Yes [provider]  omeprazole (PRILOSEC) 20 MG capsule Take one capsule once daily. Patient taking differently: Take 40 mg by mouth daily. Take one capsule once daily. 05/31/15  Yes Paulina Fusi, MD  tiotropium (SPIRIVA HANDIHALER) 18 MCG inhalation capsule Place 1 capsule (18 mcg total) into inhaler and inhale daily. 06/12/15  Yes Plonk, Gwyndolyn Saxon,  MD  tiZANidine (ZANAFLEX) 2 MG tablet Take by mouth every 6 (six) hours as needed for muscle spasms.   Yes [provider]    Family History Family History  Problem Relation Age of Onset  . Cancer Mother     Social History Social History   Tobacco Use  . Smoking status: Current Every Day Smoker    Packs/day: 1.00    Types: Cigarettes  . Smokeless tobacco: Never Used  Substance Use Topics  . Alcohol use: Yes    Alcohol/week: 8.0 standard drinks    Types: 8 Glasses of wine per week    Comment: 2 large bottles of wine daily  . Drug use: No      Allergies   Olanzapine; Quetiapine; Seroquel [quetiapine fumarate]; Celecoxib; Citalopram; Fluoxetine; Ibuprofen; Iron; Meloxicam; and Imipramine   Review of Systems Review of Systems  Musculoskeletal: Positive for back pain.     Physical Exam Triage Vital Signs ED Triage Vitals  Enc Vitals Group     BP 11/16/17 1032 132/85     Pulse Rate 11/16/17 1032 86     Resp 11/16/17 1032 18     Temp 11/16/17 1032 98.6 F (37 C)     Temp Source 11/16/17 1032 Oral     SpO2 11/16/17 1032 98 %     Weight 11/16/17 1030 202 lb (91.6 kg)     Height --      Head Circumference --      Peak Flow --      Pain Score 11/16/17 1030 10     Pain Loc --      Pain Edu? --      Excl. in [place]? --    No data found.  Updated Vital Signs BP 132/85 (BP Location: Left Arm)   Pulse 86   Temp 98.6 F (37 C) (Oral)   Resp 18   Wt 91.6 kg   LMP 09/15/2012 (Approximate)   SpO2 98%   BMI 32.60 kg/m   Visual Acuity Right Eye Distance:   Left Eye Distance:   Bilateral Distance:    Right Eye Near:   Left Eye Near:    Bilateral Near:     Physical Exam  Constitutional: She is oriented to person, place, and time. She appears well-developed and well-nourished. No distress.  HENT:  Head: Normocephalic and atraumatic.  Neck: Normal range of motion. Neck supple. No tracheal deviation present. No thyromegaly present.  Cardiovascular: Normal rate, regular rhythm and normal heart sounds.  Pulmonary/Chest: Effort normal and breath sounds normal. No stridor. No respiratory distress. She has no wheezes. She has no rales. She exhibits tenderness (left lower ribs).  Musculoskeletal: She exhibits no edema.       Lumbar back: She exhibits tenderness and spasm. She exhibits no swelling, no edema, no deformity, no laceration and normal pulse.  Lymphadenopathy:    She has no cervical adenopathy.  Neurological: She is alert and oriented to person, place, and time. She has normal reflexes. No cranial nerve  deficit. She exhibits normal muscle tone. Coordination normal.  Skin: Rash (scaly, erythematous patch) noted. She is not diaphoretic.  Nursing note and vitals reviewed.    UC Treatments / Results  Labs (all labs ordered are listed, but only abnormal results are displayed) Labs Reviewed - No data to display  EKG None  Radiology Dg Ribs Unilateral W/chest Left  Result Date: 11/16/2017 CLINICAL DATA:  Fall last evening.  Left rib pain. EXAM: LEFT RIBS AND CHEST -  3+ VIEW COMPARISON:  CT of the abdomen and pelvis 08/26/2017 FINDINGS: Heart size is normal. A moderate-sized hiatal hernia is again seen. The lungs are clear. There is no edema or effusion. No focal airspace disease is present. Dedicated imaging ribs demonstrates no acute or healing fractures IMPRESSION: 1. No acute or healing trauma. 2. Moderate size cardiomegaly. 3. No acute cardiopulmonary disease. Electronically Signed   By: San Morelle M.D.   On: 11/16/2017 11:20    Procedures Procedures (including critical care time)  Medications Ordered in UC Medications - No data to display  Initial Impression / Assessment and Plan / UC Course  I have reviewed the triage vital signs and the nursing notes.  Pertinent labs & imaging results that were available during my care of the patient were reviewed by me and considered in my medical decision making (see chart for details).      Final Clinical Impressions(s) / UC Diagnoses   Final diagnoses:  Contusion of left chest wall, initial encounter  Strain of lumbar region, initial encounter  Eczema, unspecified type     Discharge Instructions     Rest, ice, over the counter tylenol as needed Over the counter cortisone for right elbow rash    ED Prescriptions    None     1. X-ray results and diagnosis reviewed with patient 2.Recommend supportive treatment as above  3. Follow-up prn if symptoms worsen or don't improve   Controlled Substance Prescriptions [place]  Controlled Substance Registry consulted? Not Applicable   Norval Gable, MD 11/16/17 [phone]

## 2017-11-16 NOTE — Discharge Instructions (Addendum)
Rest, ice, over the counter tylenol as needed Over the counter cortisone for right elbow rash

## 2018-01-29 NOTE — Progress Notes (Signed)
[place]  Telephone:(336) [phone] Fax:(336) [phone]  ID: Carla Conley OB: [birth year]  MR#: [id number]  AOZ#:[id number]  Patient Care Team: [facility] as PCP - General  CHIEF COMPLAINT: Iron deficiency anemia.  INTERVAL HISTORY: Patient returns to clinic today for repeat laboratory work, further evaluation, and consideration of additional IV Feraheme.  She currently feels well and is asymptomatic.  She does not complain of weakness or fatigue.  She has no neurologic complaints.  She denies any recent fevers or illnesses.  She has no chest pain or shortness of breath.  She denies any nausea, vomiting, constipation, or diarrhea.  She has no melena or hematochezia.  She has no urinary complaints.  Patient offers no specific complaints today.  REVIEW OF SYSTEMS:   Review of Systems  Constitutional: Negative.  Negative for fever, malaise/fatigue and weight loss.  Respiratory: Negative.  Negative for cough, hemoptysis and shortness of breath.   Cardiovascular: Negative.  Negative for chest pain and leg swelling.  Gastrointestinal: Negative.  Negative for abdominal pain, blood in stool and melena.  Genitourinary: Negative.  Negative for hematuria.  Musculoskeletal: Negative.  Negative for back pain.  Skin: Negative.  Negative for rash.  Neurological: Negative.  Negative for sensory change, focal weakness and weakness.  Psychiatric/Behavioral: Negative.  Negative for depression, substance abuse and suicidal ideas. The patient is not nervous/anxious.     As per HPI. Otherwise, a complete review of systems is negative.  PAST MEDICAL HISTORY: Past Medical History:  Diagnosis Date  . Acid reflux   . Allergy   . Anemia   . Anxiety   . Asthma   . Collagen vascular disease ([place])   . COPD (chronic obstructive pulmonary disease) ([place])   . Depression   . Diarrhea   . Emphysema of lung ([place])   . Frequent headaches   . Head injury   . Osteoarthritis   .  Paranoid disorder ([place])   . Pre-diabetes   . Psychiatric disorder   . Seasonal allergic rhinitis   . Sleep apnea   . Suicidal overdose ([place])     PAST SURGICAL HISTORY: Past Surgical History:  Procedure Laterality Date  . COLONOSCOPY WITH PROPOFOL N/A 10/08/2016   Procedure: COLONOSCOPY WITH PROPOFOL;  Surgeon: Lollie Sails, MD;  Location: [facility] ENDOSCOPY;  Service: Endoscopy;  Laterality: N/A;  . ESOPHAGOGASTRODUODENOSCOPY (EGD) WITH PROPOFOL N/A 10/08/2016   Procedure: ESOPHAGOGASTRODUODENOSCOPY (EGD) WITH PROPOFOL;  Surgeon: Lollie Sails, MD;  Location: [facility] ENDOSCOPY;  Service: Endoscopy;  Laterality: N/A;  . NASAL SEPTUM SURGERY    . TONSILLECTOMY      FAMILY HISTORY: Family History  Problem Relation Age of Onset  . Cancer Mother     ADVANCED DIRECTIVES (Y/N):  N  HEALTH MAINTENANCE: Social History   Tobacco Use  . Smoking status: Current Every Day Smoker    Packs/day: 1.00    Types: Cigarettes  . Smokeless tobacco: Never Used  Substance Use Topics  . Alcohol use: Yes    Alcohol/week: 8.0 standard drinks    Types: 8 Glasses of wine per week    Comment: 2 large bottles of wine daily  . Drug use: No     Colonoscopy:  PAP:  Bone density:  Lipid panel:  Allergies  Allergen Reactions  . Olanzapine Shortness Of Breath    numbness Pt stated her body shuts down   . Quetiapine Anaphylaxis  . Seroquel [Quetiapine Fumarate] Anaphylaxis    Mulitple psychiatric drug reactions, but unable to  list  . Celecoxib Other (See Comments)    GI bleed  . Citalopram Other (See Comments)    GI bleed  . Ferrous Gluconate   . Fluoxetine   . Ibuprofen   . Iron   . Meloxicam Other (See Comments)  . Imipramine Other (See Comments)    Gas pains    Current Outpatient Medications  Medication Sig Dispense Refill  . albuterol (PROVENTIL HFA;VENTOLIN HFA) 108 (90 Base) MCG/ACT inhaler Inhale 2 puffs into the lungs every 6 (six) hours as needed for wheezing or shortness  of breath. 1 Inhaler 1  . cetirizine (ZYRTEC) 10 MG tablet Take 1 tablet (10 mg total) by mouth daily. (Patient taking differently: Take 10 mg by mouth 2 (two) times daily. ) 30 tablet 2  . diazepam (VALIUM) 10 MG tablet Take 10 mg by mouth 3 (three) times daily. psych    . nystatin ointment (MYCOSTATIN) Apply 1 application topically as needed.    Marland Kitchen omeprazole (PRILOSEC) 20 MG capsule Take one capsule once daily. (Patient taking differently: Take 40 mg by mouth daily. Take one capsule once daily.) 21 capsule 0  . tiotropium (SPIRIVA HANDIHALER) 18 MCG inhalation capsule Place 1 capsule (18 mcg total) into inhaler and inhale daily. 30 capsule 2  . tiZANidine (ZANAFLEX) 2 MG tablet Take by mouth every 6 (six) hours as needed for muscle spasms.     No current facility-administered medications for this visit.     OBJECTIVE: Vitals:   01/31/18 1316  BP: 130/80  Pulse: 78  Temp: (!) 97.3 F (36.3 C)     Body mass index is 32.6 kg/m.    ECOG FS:0 - Asymptomatic  General: Well-developed, well-nourished, no acute distress. Eyes: Pink conjunctiva, anicteric sclera. HEENT: Normocephalic, moist mucous membranes. Lungs: Clear to auscultation bilaterally. Heart: Regular rate and rhythm. No rubs, murmurs, or gallops. Abdomen: Soft, nontender, nondistended. No organomegaly noted, normoactive bowel sounds. Musculoskeletal: No edema, cyanosis, or clubbing. Neuro: Alert, answering all questions appropriately. Cranial nerves grossly intact. Skin: No rashes or petechiae noted. Psych: Normal affect.  LAB RESULTS:  Lab Results  Component Value Date   NA 140 11/15/2017   K 4.4 11/15/2017   CL 104 11/15/2017   CO2 27 11/15/2017   GLUCOSE 139 (H) 11/15/2017   BUN 7 11/15/2017   CREATININE 0.98 11/15/2017   CALCIUM 8.5 (L) 11/15/2017   PROT 6.7 11/15/2017   ALBUMIN 3.6 11/15/2017   AST 41 11/15/2017   ALT 46 (H) 11/15/2017   ALKPHOS 104 11/15/2017   BILITOT 0.4 11/15/2017   GFRNONAA >60  11/15/2017   GFRAA >60 11/15/2017    Lab Results  Component Value Date   WBC 8.2 01/31/2018   NEUTROABS 5.0 01/31/2018   HGB 9.7 (L) 01/31/2018   HCT 34.2 (L) 01/31/2018   MCV 90.0 01/31/2018   PLT 262 01/31/2018   Lab Results  Component Value Date   IRON 23 (L) 01/31/2018   TIBC 534 (H) 01/31/2018   IRONPCTSAT 4 (L) 01/31/2018   Lab Results  Component Value Date   FERRITIN 5 (L) 01/31/2018     STUDIES: No results found.  ASSESSMENT: Iron deficiency anemia.  PLAN:    1.  Iron deficiency anemia: Colonoscopy and EGD on October 08, 2016 revealed multiple colonic polyps, but no other abnormalities or obvious sites of bleeding.  Both patient hemoglobin and iron stores have trended down, but she declined additional treatment with Feraheme today.  Previously, the remainder of her laboratory work was either  negative or within normal limits. She last received treatment on May 17, 2017.  Return to clinic in 3 months with repeat laboratory work, further evaluation, and consideration of additional Feraheme.   2.  B12 deficiency: Likely dietary related.  Patient last received a B12 injection on May 17, 2017. 3.  History of suicidal ideation: Patient denies this today.    Patient expressed understanding and was in agreement with this plan. She also understands that She can call clinic at any time with any questions, concerns, or complaints.    Lloyd Huger, MD   02/02/2018 12:16 PM

## 2018-01-30 ENCOUNTER — Other Ambulatory Visit: Payer: Self-pay | Admitting: *Deleted

## 2018-01-30 DIAGNOSIS — D649 Anemia, unspecified: Secondary | ICD-10-CM

## 2018-01-31 ENCOUNTER — Inpatient Hospital Stay: Payer: Medicare HMO

## 2018-01-31 ENCOUNTER — Other Ambulatory Visit: Payer: Self-pay

## 2018-01-31 ENCOUNTER — Inpatient Hospital Stay (HOSPITAL_BASED_OUTPATIENT_CLINIC_OR_DEPARTMENT_OTHER): Payer: Medicare HMO | Admitting: Oncology

## 2018-01-31 ENCOUNTER — Inpatient Hospital Stay: Payer: Medicare HMO | Attending: Oncology

## 2018-01-31 VITALS — BP 130/80 | HR 78 | Temp 97.3°F | Wt 202.0 lb

## 2018-01-31 DIAGNOSIS — D509 Iron deficiency anemia, unspecified: Secondary | ICD-10-CM | POA: Diagnosis present

## 2018-01-31 DIAGNOSIS — F17210 Nicotine dependence, cigarettes, uncomplicated: Secondary | ICD-10-CM

## 2018-01-31 DIAGNOSIS — Z79899 Other long term (current) drug therapy: Secondary | ICD-10-CM

## 2018-01-31 DIAGNOSIS — D649 Anemia, unspecified: Secondary | ICD-10-CM

## 2018-01-31 DIAGNOSIS — E538 Deficiency of other specified B group vitamins: Secondary | ICD-10-CM | POA: Insufficient documentation

## 2018-01-31 DIAGNOSIS — K449 Diaphragmatic hernia without obstruction or gangrene: Secondary | ICD-10-CM | POA: Insufficient documentation

## 2018-01-31 DIAGNOSIS — D500 Iron deficiency anemia secondary to blood loss (chronic): Secondary | ICD-10-CM

## 2018-01-31 LAB — CBC WITH DIFFERENTIAL/PLATELET
ABS IMMATURE GRANULOCYTES: 0.02 10*3/uL (ref 0.00–0.07)
Basophils Absolute: 0.1 10*3/uL (ref 0.0–0.1)
Basophils Relative: 1 %
EOS ABS: 0.3 10*3/uL (ref 0.0–0.5)
Eosinophils Relative: 3 %
HCT: 34.2 % — ABNORMAL LOW (ref 36.0–46.0)
HEMOGLOBIN: 9.7 g/dL — AB (ref 12.0–15.0)
IMMATURE GRANULOCYTES: 0 %
LYMPHS ABS: 2.4 10*3/uL (ref 0.7–4.0)
Lymphocytes Relative: 29 %
MCH: 25.5 pg — ABNORMAL LOW (ref 26.0–34.0)
MCHC: 28.4 g/dL — ABNORMAL LOW (ref 30.0–36.0)
MCV: 90 fL (ref 80.0–100.0)
Monocytes Absolute: 0.5 10*3/uL (ref 0.1–1.0)
Monocytes Relative: 6 %
NEUTROS PCT: 61 %
Neutro Abs: 5 10*3/uL (ref 1.7–7.7)
Platelets: 262 10*3/uL (ref 150–400)
RBC: 3.8 MIL/uL — ABNORMAL LOW (ref 3.87–5.11)
RDW: 17.3 % — ABNORMAL HIGH (ref 11.5–15.5)
WBC: 8.2 10*3/uL (ref 4.0–10.5)
nRBC: 0 % (ref 0.0–0.2)

## 2018-01-31 LAB — IRON AND TIBC
Iron: 23 ug/dL — ABNORMAL LOW (ref 28–170)
Saturation Ratios: 4 % — ABNORMAL LOW (ref 10.4–31.8)
TIBC: 534 ug/dL — ABNORMAL HIGH (ref 250–450)
UIBC: 511 ug/dL

## 2018-01-31 LAB — FERRITIN: Ferritin: 5 ng/mL — ABNORMAL LOW (ref 11–307)

## 2018-01-31 NOTE — Progress Notes (Signed)
Patient is here today to follow up on her iron deficiency anemia. Patient stated that she had been doing well.

## 2018-03-13 ENCOUNTER — Encounter
Admission: RE | Disposition: A | Discharge status: Home / Self Care | Payer: Self-pay | Source: Home / Self Care | Attending: Unknown Physician Specialty

## 2018-03-13 ENCOUNTER — Ambulatory Visit
Admission: RE | Admit: 2018-03-13 | Discharge: 2018-03-13 | Disposition: A | Discharge status: Home / Self Care | Payer: Medicare HMO | Attending: Unknown Physician Specialty | Admitting: Unknown Physician Specialty

## 2018-03-13 SURGERY — ESOPHAGOGASTRODUODENOSCOPY (EGD) WITH PROPOFOL
Anesthesia: General

## 2018-05-03 NOTE — Progress Notes (Signed)
Carla Conley  [phone] [phone]  ID: [name] OB: 03/05/1960  MR#: [id number]  OAC#:[id number]  Patient Care Team: [facility] as [facility]CP - General  CHIEF COMPLAINT: Iron deficiency anemia.  INTERVAL HISTORY: Patient returns to clinic today for repeat laboratory work, further evaluation, and consideration of IV Feraheme.  Patient states she feels "weird" since starting Celexa recently, but otherwise feels well.  She does not complain of weakness or fatigue.  She has no neurologic complaints. She denies any recent fevers or illnesses.  She has no chest pain or shortness of breath.  She denies any nausea, vomiting, constipation, or diarrhea.  She has no melena or hematochezia.  She has no urinary complaints.  Patient otherwise feels well and offers no further specific complaints today.  REVIEW OF SYSTEMS:   Review of Systems  Constitutional: Negative.  Negative for fever, malaise/fatigue and weight loss.  Respiratory: Negative.  Negative for cough, hemoptysis and shortness of breath.   Cardiovascular: Negative.  Negative for chest pain and leg swelling.  Gastrointestinal: Negative.  Negative for abdominal pain, blood in stool and melena.  Genitourinary: Negative.  Negative for hematuria.  Musculoskeletal: Negative.  Negative for back pain.  Skin: Negative.  Negative for rash.  Neurological: Negative.  Negative for sensory change, focal weakness and weakness.  Psychiatric/Behavioral: Negative.  Negative for depression, substance abuse and suicidal ideas. The patient is not nervous/anxious.     As per HPI. Otherwise, a complete review of systems is negative.  PAST MEDICAL HISTORY: Past Medical History:  Diagnosis Date  . Acid reflux   . Allergy   . Anemia   . Anxiety   . Asthma   . Collagen vascular disease ([place])   . COPD (chronic obstructive pulmonary disease) ([place])   . Depression   . Diarrhea   . Emphysema of lung ([place])    . Frequent headaches   . Head injury   . Osteoarthritis   . Paranoid disorder ([place])   . Pre-diabetes   . Psychiatric disorder   . Seasonal allergic rhinitis   . Sleep apnea   . Suicidal overdose ([place])     PAST SURGICAL HISTORY: Past Surgical History:  Procedure Laterality Date  . COLONOSCOPY WITH PROPOFOL N/A 10/08/2016   Procedure: COLONOSCOPY WITH PROPOFOL;  Surgeon: Lollie Sails, MD;  Location: [facility] ENDOSCOPY;  Service: Endoscopy;  Laterality: N/A;  . ESOPHAGOGASTRODUODENOSCOPY (EGD) WITH PROPOFOL N/A 10/08/2016   Procedure: ESOPHAGOGASTRODUODENOSCOPY (EGD) WITH PROPOFOL;  Surgeon: Lollie Sails, MD;  Location: [facility] ENDOSCOPY;  Service: Endoscopy;  Laterality: N/A;  . NASAL SEPTUM SURGERY    . TONSILLECTOMY      FAMILY HISTORY: Family History  Problem Relation Age of Onset  . Cancer Mother     ADVANCED DIRECTIVES (Y/N):  N  HEALTH MAINTENANCE: Social History   Tobacco Use  . Smoking status: Current Every Day Smoker    Packs/day: 1.00    Types: Cigarettes  . Smokeless tobacco: Never Used  Substance Use Topics  . Alcohol use: Yes    Alcohol/week: 8.0 standard drinks    Types: 8 Glasses of wine per week    Comment: 2 large bottles of wine daily  . Drug use: No     Colonoscopy:  PAP:  Bone density:  Lipid panel:  Allergies  Allergen Reactions  . Olanzapine Shortness Of Breath    numbness Pt stated her body shuts down   . Quetiapine Anaphylaxis  . Seroquel [Quetiapine Fumarate] Anaphylaxis  Mulitple psychiatric drug reactions, but unable to list  . Celecoxib Other (See Comments)    GI bleed  . Citalopram Other (See Comments)    GI bleed  . Ferrous Gluconate   . Fluoxetine   . Ibuprofen   . Iron   . Meloxicam Other (See Comments)  . Imipramine Other (See Comments)    Gas pains    Current Outpatient Medications  Medication Sig Dispense Refill  . albuterol (PROVENTIL HFA;VENTOLIN HFA) 108 (90 Base) MCG/ACT inhaler Inhale 2 puffs into  the lungs every 6 (six) hours as needed for wheezing or shortness of breath. 1 Inhaler 1  . cetirizine (ZYRTEC) 10 MG tablet Take 1 tablet (10 mg total) by mouth daily. (Patient taking differently: Take 10 mg by mouth 2 (two) times daily. ) 30 tablet 2  . citalopram (CELEXA) 10 MG tablet Take by mouth.    . diazepam (VALIUM) 10 MG tablet Take 10 mg by mouth 3 (three) times daily. psych    . omeprazole (PRILOSEC) 20 MG capsule Take one capsule once daily. (Patient taking differently: Take 40 mg by mouth daily. Take one capsule once daily.) 21 capsule 0  . omeprazole (PRILOSEC) 40 MG capsule Take 40 mg by mouth daily.    Marland Kitchen tiotropium (SPIRIVA HANDIHALER) 18 MCG inhalation capsule Place 1 capsule (18 mcg total) into inhaler and inhale daily. 30 capsule 2  . tiZANidine (ZANAFLEX) 4 MG tablet     . valACYclovir (VALTREX) 1000 MG tablet Take 1,000 mg by mouth 2 (two) times daily.     No current facility-administered medications for this visit.    Facility-Administered Medications Ordered in Other Visits  Medication Dose Route Frequency Provider Last Rate Last Dose  . 0.9 %  sodium chloride infusion   Intravenous Continuous Lloyd Huger, MD 10 mL/hr at 05/04/18 1516      OBJECTIVE: Vitals:   05/04/18 1356  BP: (!) 145/94  Pulse: 85  Resp: 18  Temp: 97.8 F (36.6 C)     Body mass index is 32.77 kg/m.    ECOG FS:0 - Asymptomatic  General: Well-developed, well-nourished, no acute distress. Eyes: Pink conjunctiva, anicteric sclera. HEENT: Normocephalic, moist mucous membranes. Lungs: Clear to auscultation bilaterally. Heart: Regular rate and rhythm. No rubs, murmurs, or gallops. Abdomen: Soft, nontender, nondistended. No organomegaly noted, normoactive bowel sounds. Musculoskeletal: No edema, cyanosis, or clubbing. Neuro: Alert, answering all questions appropriately. Cranial nerves grossly intact. Skin: No rashes or petechiae noted. Psych: Normal affect.  LAB RESULTS:  Lab  Results  Component Value Date   NA 140 11/15/2017   K 4.4 11/15/2017   CL 104 11/15/2017   CO2 27 11/15/2017   GLUCOSE 139 (H) 11/15/2017   BUN 7 11/15/2017   CREATININE 0.98 11/15/2017   CALCIUM 8.5 (L) 11/15/2017   PROT 6.7 11/15/2017   ALBUMIN 3.6 11/15/2017   AST 41 11/15/2017   ALT 46 (H) 11/15/2017   ALKPHOS 104 11/15/2017   BILITOT 0.4 11/15/2017   GFRNONAA >60 11/15/2017   GFRAA >60 11/15/2017    Lab Results  Component Value Date   WBC 7.8 05/04/2018   NEUTROABS 4.9 05/04/2018   HGB 10.2 (L) 05/04/2018   HCT 35.6 (L) 05/04/2018   MCV 80.9 05/04/2018   PLT 262 05/04/2018   Lab Results  Component Value Date   IRON 15 (L) 05/04/2018   TIBC 515 (H) 05/04/2018   IRONPCTSAT 3 (L) 05/04/2018   Lab Results  Component Value Date   FERRITIN 4 (L)  05/04/2018     STUDIES: No results found.  ASSESSMENT: Iron deficiency anemia.  PLAN:    1.  Iron deficiency anemia: Colonoscopy and EGD on October 08, 2016 revealed multiple colonic polyps, but no other abnormalities or obvious sites of bleeding.  Patient's hemoglobin and iron stores continue to be decreased and she has agreed to proceed with 510 mg IV Feraheme today. Previously, the remainder of her laboratory work was either negative or within normal limits.  Return to clinic in 1 week for second infusion of Feraheme and then in 4 months with repeat laboratory work, further evaluation, and consideration of additional treatment. 2.  B12 deficiency: Likely dietary related.  Patient last received a B12 injection on May 17, 2017. 3.  History of suicidal ideation: Patient denies this today.    I spent a total of 30 minutes face-to-face with the patient of which greater than 50% of the visit was spent in counseling and coordination of care as detailed above.   Patient expressed understanding and was in agreement with this plan. She also understands that She can call clinic at any time with any questions, concerns, or  complaints.    Lloyd Huger, MD   05/04/2018 3:55 PM

## 2018-05-04 ENCOUNTER — Inpatient Hospital Stay: Payer: Medicare HMO

## 2018-05-04 ENCOUNTER — Inpatient Hospital Stay (HOSPITAL_BASED_OUTPATIENT_CLINIC_OR_DEPARTMENT_OTHER): Payer: Medicare HMO | Admitting: Oncology

## 2018-05-04 ENCOUNTER — Other Ambulatory Visit: Payer: Self-pay

## 2018-05-04 ENCOUNTER — Inpatient Hospital Stay: Payer: Medicare HMO | Attending: Oncology

## 2018-05-04 VITALS — BP 134/81 | HR 70 | Resp 18

## 2018-05-04 VITALS — BP 145/94 | HR 85 | Temp 97.8°F | Resp 18 | Wt 203.0 lb

## 2018-05-04 DIAGNOSIS — D509 Iron deficiency anemia, unspecified: Secondary | ICD-10-CM | POA: Diagnosis present

## 2018-05-04 DIAGNOSIS — E538 Deficiency of other specified B group vitamins: Secondary | ICD-10-CM | POA: Diagnosis not present

## 2018-05-04 DIAGNOSIS — Z720 Tobacco use: Secondary | ICD-10-CM | POA: Insufficient documentation

## 2018-05-04 DIAGNOSIS — D649 Anemia, unspecified: Secondary | ICD-10-CM

## 2018-05-04 DIAGNOSIS — D500 Iron deficiency anemia secondary to blood loss (chronic): Secondary | ICD-10-CM

## 2018-05-04 LAB — CBC WITH DIFFERENTIAL/PLATELET
Abs Immature Granulocytes: 0.02 10*3/uL (ref 0.00–0.07)
Basophils Absolute: 0.1 10*3/uL (ref 0.0–0.1)
Basophils Relative: 1 %
EOS ABS: 0.2 10*3/uL (ref 0.0–0.5)
Eosinophils Relative: 2 %
HCT: 35.6 % — ABNORMAL LOW (ref 36.0–46.0)
Hemoglobin: 10.2 g/dL — ABNORMAL LOW (ref 12.0–15.0)
IMMATURE GRANULOCYTES: 0 %
Lymphocytes Relative: 28 %
Lymphs Abs: 2.2 10*3/uL (ref 0.7–4.0)
MCH: 23.2 pg — AB (ref 26.0–34.0)
MCHC: 28.7 g/dL — ABNORMAL LOW (ref 30.0–36.0)
MCV: 80.9 fL (ref 80.0–100.0)
MONOS PCT: 6 %
Monocytes Absolute: 0.5 10*3/uL (ref 0.1–1.0)
NEUTROS PCT: 63 %
Neutro Abs: 4.9 10*3/uL (ref 1.7–7.7)
PLATELETS: 262 10*3/uL (ref 150–400)
RBC: 4.4 MIL/uL (ref 3.87–5.11)
RDW: 16.4 % — AB (ref 11.5–15.5)
WBC: 7.8 10*3/uL (ref 4.0–10.5)
nRBC: 0 % (ref 0.0–0.2)

## 2018-05-04 LAB — IRON AND TIBC
IRON: 15 ug/dL — AB (ref 28–170)
Saturation Ratios: 3 % — ABNORMAL LOW (ref 10.4–31.8)
TIBC: 515 ug/dL — ABNORMAL HIGH (ref 250–450)
UIBC: 500 ug/dL

## 2018-05-04 LAB — FERRITIN: FERRITIN: 4 ng/mL — AB (ref 11–307)

## 2018-05-04 MED ORDER — SODIUM CHLORIDE 0.9 % IV SOLN
510.0000 mg | Freq: Once | INTRAVENOUS | Status: AC
  Administered 2018-05-04: 510 mg via INTRAVENOUS
  Filled 2018-05-04: qty 17

## 2018-05-04 MED ORDER — SODIUM CHLORIDE 0.9 % IV SOLN
INTRAVENOUS | Status: DC
  Administered 2018-05-04: 15:00:00 via INTRAVENOUS
  Filled 2018-05-04: qty 250

## 2018-05-04 NOTE — Progress Notes (Signed)
Patient denies any concerns today.  

## 2018-05-10 ENCOUNTER — Other Ambulatory Visit: Payer: Self-pay

## 2018-05-11 ENCOUNTER — Other Ambulatory Visit: Payer: Self-pay

## 2018-05-11 ENCOUNTER — Inpatient Hospital Stay: Payer: Medicare HMO

## 2018-05-11 VITALS — BP 121/82 | HR 84 | Temp 96.8°F | Resp 18

## 2018-05-11 DIAGNOSIS — D500 Iron deficiency anemia secondary to blood loss (chronic): Secondary | ICD-10-CM

## 2018-05-11 DIAGNOSIS — D509 Iron deficiency anemia, unspecified: Secondary | ICD-10-CM | POA: Diagnosis not present

## 2018-05-11 MED ORDER — SODIUM CHLORIDE 0.9 % IV SOLN
INTRAVENOUS | Status: DC
  Administered 2018-05-11: 14:00:00 via INTRAVENOUS
  Filled 2018-05-11: qty 250

## 2018-05-11 MED ORDER — SODIUM CHLORIDE 0.9 % IV SOLN
510.0000 mg | Freq: Once | INTRAVENOUS | Status: AC
  Administered 2018-05-11: 510 mg via INTRAVENOUS
  Filled 2018-05-11: qty 17

## 2018-05-11 NOTE — Progress Notes (Signed)
Patient states her allergy to iron is p.o. iron, states she has a hiatal hernia and diverticulitis that is aggravated by the iron and causes her to throw up blood.  She states she has never had a reaction to the IV iron.

## 2018-05-30 ENCOUNTER — Ambulatory Visit: Payer: Medicare HMO | Admitting: Psychology

## 2018-06-13 ENCOUNTER — Ambulatory Visit: Payer: Medicare HMO | Admitting: Psychology

## 2018-06-27 ENCOUNTER — Ambulatory Visit: Payer: Medicare HMO | Admitting: Psychology

## 2018-07-14 ENCOUNTER — Ambulatory Visit
Admission: EM | Admit: 2018-07-14 | Discharge: 2018-07-14 | Disposition: A | Discharge status: Home / Self Care | Payer: Medicare HMO | Attending: Family Medicine | Admitting: Family Medicine

## 2018-07-14 ENCOUNTER — Other Ambulatory Visit: Payer: Self-pay

## 2018-07-14 ENCOUNTER — Encounter: Payer: Self-pay | Admitting: Emergency Medicine

## 2018-07-14 DIAGNOSIS — B356 Tinea cruris: Secondary | ICD-10-CM | POA: Diagnosis not present

## 2018-07-14 NOTE — Discharge Instructions (Addendum)
Over the counter antifungal powder (example: Tinactin powder) two to three times per day Keep area as dry as possible
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
## 2018-07-14 NOTE — ED Provider Notes (Signed)
[facility]    CSN: [id number] Arrival date & time: 07/14/18  0803     History   Chief Complaint Chief Complaint  Patient presents with  . Groin Pain    HPI Carla Conley is a 58 y.o. female.   58 yo female with a c/o a rash to her left groin area. States she is concerned she may have "herpes". States rash started about 3 days ago. Denies any blisters, fevers, chills. States has had a similar rash in the past on the right groin area and also under her breasts.      Past Medical History:  Diagnosis Date  . Acid reflux   . Allergy   . Anemia   . Anxiety   . Asthma   . Collagen vascular disease ([place])   . COPD (chronic obstructive pulmonary disease) ([place])   . Depression   . Diarrhea   . Emphysema of lung ([place])   . Frequent headaches   . Head injury   . Osteoarthritis   . Paranoid disorder ([place])   . Pre-diabetes   . Psychiatric disorder   . Seasonal allergic rhinitis   . Sleep apnea   . Suicidal overdose [facility])     Patient Active Problem List   Diagnosis Date Noted  . Hiatal hernia 01/31/2018  . Moderate episode of recurrent major depressive disorder ([place]) 07/09/2017  . B12 deficiency 05/06/2017  . Hematuria 05/06/2017  . Elevated alkaline phosphatase level 05/06/2017  . Goals of care, counseling/discussion 05/06/2017  . Iron deficiency anemia due to chronic blood loss 05/05/2017  . Primary osteoarthritis of both first carpometacarpal joints 06/22/2016  . Overactive bladder 07/26/2015  . Bipolar affective disorder, current episode mixed ([place]) 06/26/2015  . IBS (irritable bowel syndrome) 06/12/2015  . Urinary retention 06/12/2015  . Smoker 06/12/2015  . Abnormal Pap smear of cervix 03/23/2013  . Allergic rhinitis 08/03/2012  . Airway hyperreactivity 08/03/2012  . Bipolar affective disorder ([place]) 08/03/2012  . Borderline personality disorder ([place]) 08/03/2012  . Chronic obstructive pulmonary disease ([place]) 08/03/2012  . Acid reflux 08/03/2012    Past Surgical History:  Procedure Laterality Date  . COLONOSCOPY WITH PROPOFOL N/A 10/08/2016   Procedure: COLONOSCOPY WITH PROPOFOL;  Surgeon: Lollie Sails, MD;  Location: [facility] ENDOSCOPY;  Service: Endoscopy;  Laterality: N/A;  . ESOPHAGOGASTRODUODENOSCOPY (EGD) WITH PROPOFOL N/A 10/08/2016   Procedure: ESOPHAGOGASTRODUODENOSCOPY (EGD) WITH PROPOFOL;  Surgeon: Lollie Sails, MD;  Location[facility] ENDOSCOPY;  Service: Endoscopy;  Laterality: N/A;  . NASAL SEPTUM SURGERY    . TONSILLECTOMY      OB History   No obstetric history on file.      Home Medications    Prior to Admission medications   Medication Sig Start Date End Date Taking? Authorizing Provider  albuterol (PROVENTIL HFA;VENTOLIN HFA) 108 (90 Base) MCG/ACT inhaler Inhale 2 puffs into the lungs every 6 (six) hours as needed for wheezing or shortness of breath. 05/31/15  Yes Paulina Fusi, MD  cetirizine (ZYRTEC) 10 MG tablet Take 1 tablet (10 mg total) by mouth daily. Patient taking differently: Take 10 mg by mouth 2 (two) times daily.  06/12/15  Yes Plonk, Gwyndolyn Saxon, MD  diazepam (VALIUM) 10 MG tablet Take 10 mg by mouth 3 (three) times daily. psych   Yes [provider]  omeprazole (PRILOSEC) 40 MG capsule Take 40 mg by mouth daily. 04/14/18  Yes [provider]  tiotropium (SPIRIVA HANDIHALER) 18 MCG inhalation capsule Place 1 capsule (18 mcg total) into inhaler and  inhale daily. 06/12/15  Yes Plonk, Gwyndolyn Saxon, MD  tiZANidine (ZANAFLEX) 4 MG tablet  04/25/18  Yes [provider]  valACYclovir (VALTREX) 1000 MG tablet Take 1,000 mg by mouth 2 (two) times daily. 02/09/18  Yes [provider]  citalopram (CELEXA) 10 MG tablet Take by mouth. 04/06/18   [provider]  omeprazole (PRILOSEC) 20 MG capsule Take one capsule once daily. Patient taking differently: Take 40 mg by mouth daily. Take one capsule once daily. 05/31/15   Paulina Fusi, MD    Family History Family History   Problem Relation Age of Onset  . Cancer Mother     Social History Social History   Tobacco Use  . Smoking status: Current Every Day Smoker    Packs/day: 1.00    Types: Cigarettes  . Smokeless tobacco: Never Used  Substance Use Topics  . Alcohol use: Yes    Alcohol/week: 8.0 standard drinks    Types: 8 Glasses of wine per week    Comment: 2 large bottles of wine daily  . Drug use: No     Allergies   Olanzapine; Quetiapine; Seroquel [quetiapine fumarate]; Celecoxib; Citalopram; Ferrous gluconate; Fluoxetine; Ibuprofen; Iron; Meloxicam; and Imipramine   Review of Systems Review of Systems   Physical Exam Triage Vital Signs ED Triage Vitals  Enc Vitals Group     BP 07/14/18 0822 (!) 145/85     Pulse Rate 07/14/18 0822 90     Resp 07/14/18 0822 16     Temp 07/14/18 0822 98.2 F (36.8 C)     Temp Source 07/14/18 0822 Oral     SpO2 07/14/18 0822 96 %     Weight 07/14/18 0818 203 lb (92.1 kg)     Height 07/14/18 0818 5\' 6"  (1.676 m)     Head Circumference --      Peak Flow --      Pain Score 07/14/18 0818 5     Pain Loc --      Pain Edu? --      Excl. in [place]? --    No data found.  Updated Vital Signs BP (!) 145/85 (BP Location: Left Arm)   Pulse 90   Temp 98.2 F (36.8 C) (Oral)   Resp 16   Ht 5\' 6"  (1.676 m)   Wt 92.1 kg   LMP 09/15/2012 (Approximate)   SpO2 96%   BMI 32.77 kg/m   Visual Acuity Right Eye Distance:   Left Eye Distance:   Bilateral Distance:    Right Eye Near:   Left Eye Near:    Bilateral Near:     Physical Exam Vitals signs and nursing note reviewed. Exam conducted with a chaperone present Nira Conn, RN present).  Constitutional:      General: She is not in acute distress.    Appearance: She is not toxic-appearing or diaphoretic.  Genitourinary:    Exam position: Supine.     Pubic Area: Rash (erythematous, scaly rash to left groin area; no vesicular lesions, ulcers or other lesions) present.    Neurological:     Mental  Status: She is alert.      UC Treatments / Results  Labs (all labs ordered are listed, but only abnormal results are displayed) Labs Reviewed - No data to display  EKG None  Radiology No results found.  Procedures Procedures (including critical care time)  Medications Ordered in UC Medications - No data to display  Initial Impression / Assessment and Plan / UC Course  I  have reviewed the triage vital signs and the nursing notes.  Pertinent labs & imaging results that were available during my care of the patient were reviewed by me and considered in my medical decision making (see chart for details).      Final Clinical Impressions(s) / UC Diagnoses   Final diagnoses:  Tinea cruris     Discharge Instructions     Over the counter antifungal powder (example: Tinactin powder) two to three times per day Keep area as dry as possible    ED Prescriptions    None     1.diagnosis reviewed with patient 2. Recommend supportive treatment as above 3. Follow-up prn if symptoms worsen or don't improve  Controlled Substance Prescriptions [place] Controlled Substance Registry consulted? Not Applicable   Norval Gable, MD 07/14/18 1032

## 2018-08-01 ENCOUNTER — Ambulatory Visit: Payer: Medicare HMO | Admitting: Psychology

## 2018-08-03 ENCOUNTER — Telehealth: Payer: Self-pay

## 2018-08-03 ENCOUNTER — Other Ambulatory Visit: Payer: Self-pay

## 2018-08-03 ENCOUNTER — Emergency Department
Admission: EM | Admit: 2018-08-03 | Discharge: 2018-08-03 | Disposition: A | Discharge status: Home / Self Care | Payer: Medicare HMO | Attending: Emergency Medicine | Admitting: Emergency Medicine

## 2018-08-03 DIAGNOSIS — Z5321 Procedure and treatment not carried out due to patient leaving prior to being seen by health care provider: Secondary | ICD-10-CM | POA: Insufficient documentation

## 2018-08-03 DIAGNOSIS — R109 Unspecified abdominal pain: Secondary | ICD-10-CM | POA: Diagnosis present

## 2018-08-03 LAB — CBC
HCT: 43.3 % (ref 36.0–46.0)
Hemoglobin: 14.4 g/dL (ref 12.0–15.0)
MCH: 35.2 pg — ABNORMAL HIGH (ref 26.0–34.0)
MCHC: 33.3 g/dL (ref 30.0–36.0)
MCV: 105.9 fL — ABNORMAL HIGH (ref 80.0–100.0)
Platelets: 203 10*3/uL (ref 150–400)
RBC: 4.09 MIL/uL (ref 3.87–5.11)
RDW: 17.2 % — ABNORMAL HIGH (ref 11.5–15.5)
WBC: 6.7 10*3/uL (ref 4.0–10.5)
nRBC: 0 % (ref 0.0–0.2)

## 2018-08-03 LAB — COMPREHENSIVE METABOLIC PANEL
ALT: 43 U/L (ref 0–44)
AST: 37 U/L (ref 15–41)
Albumin: 3.5 g/dL (ref 3.5–5.0)
Alkaline Phosphatase: 97 U/L (ref 38–126)
Anion gap: 11 (ref 5–15)
BUN: 8 mg/dL (ref 6–20)
CO2: 25 mmol/L (ref 22–32)
Calcium: 8.5 mg/dL — ABNORMAL LOW (ref 8.9–10.3)
Chloride: 104 mmol/L (ref 98–111)
Creatinine, Ser: 0.83 mg/dL (ref 0.44–1.00)
GFR calc Af Amer: >60 mL/min (ref >60)
GFR calc non Af Amer: >60 mL/min (ref >60)
Glucose, Bld: 100 mg/dL — ABNORMAL HIGH (ref 70–99)
Potassium: 4 mmol/L (ref 3.5–5.1)
Sodium: 140 mmol/L (ref 135–145)
Total Bilirubin: 0.2 mg/dL — ABNORMAL LOW (ref 0.3–1.2)
Total Protein: 6.3 g/dL — ABNORMAL LOW (ref 6.5–8.1)

## 2018-08-03 LAB — ETHANOL: Alcohol, Ethyl (B): 73 mg/dL — ABNORMAL HIGH (ref <10)

## 2018-08-03 LAB — TYPE AND SCREEN
ABO/RH(D): A POS
Antibody Screen: NEGATIVE

## 2018-08-03 NOTE — ED Triage Notes (Signed)
Ems-abd pain for years.  Worse today.  Says sometimes rectal bleeding.  Has had some wine today.  vss iv 20 rac.

## 2018-08-03 NOTE — ED Triage Notes (Signed)
Pt comes into the ED via EMS from home with c/o abd pain , tarry to yellow colored loose stools for over a year. States she has been having issues with transportation and getting into see her doctor. Pt states she drinks daily, states she has been drinking today.

## 2018-08-03 NOTE — Telephone Encounter (Signed)
ED RNCM received call from [facility] assistant stating that patient left message on ext. [phone] stating that EMS told her that CM would provide taxi voucher to home when she left ED.  THis RNCM contacted patient and patient explained that "EMS told her that case management would provide taxi voucher for her to return to home from hospital".  This RNCM explained that we don't have this service available.  She said that "I would have never came to the hospital if I'd known that".  I apologized to her and she hung up on me after she said she "would just walk home them".

## 2018-09-02 NOTE — Progress Notes (Signed)
[place]  [phone] [phone]) [phone]  ID: Carla Conley OB: 05-Jun-1960  MR#: [id number]  QIH#:[id number]  Patient Care Team: [facility] as PCP - General  CHIEF COMPLAINT: Iron deficiency anemia.  INTERVAL HISTORY: Patient returns to clinic today for repeat laboratory work, further evaluation, and consideration of additional IV Feraheme.  She currently feels well and is asymptomatic.  She does not complain of weakness or fatigue.  She has no neurologic complaints. She denies any recent fevers or illnesses.  She denies any chest pain, shortness of breath, cough, or hemoptysis.  She denies any nausea, vomiting, constipation, or diarrhea.  She has no melena or hematochezia.  She has no urinary complaints.  Patient offers no specific complaints today.  REVIEW OF SYSTEMS:   Review of Systems  Constitutional: Negative.  Negative for fever, malaise/fatigue and weight loss.  Respiratory: Negative.  Negative for cough, hemoptysis and shortness of breath.   Cardiovascular: Negative.  Negative for chest pain and leg swelling.  Gastrointestinal: Negative.  Negative for abdominal pain, blood in stool and melena.  Genitourinary: Negative.  Negative for hematuria.  Musculoskeletal: Negative.  Negative for back pain.  Skin: Negative.  Negative for rash.  Neurological: Negative.  Negative for sensory change, focal weakness and weakness.  Psychiatric/Behavioral: Negative.  Negative for depression, substance abuse and suicidal ideas. The patient is not nervous/anxious.     As per HPI. Otherwise, a complete review of systems is negative.  PAST MEDICAL HISTORY: Past Medical History:  Diagnosis Date  . Acid reflux   . Allergy   . Anemia   . Anxiety   . Asthma   . Collagen vascular disease ([place])   . COPD (chronic obstructive pulmonary disease) ([place])   . Depression   . Diarrhea   . Emphysema of lung ([place])   . Frequent headaches   . Head injury   .  Osteoarthritis   . Paranoid disorder ([place])   . Pre-diabetes   . Psychiatric disorder   . Seasonal allergic rhinitis   . Sleep apnea   . Suicidal overdose ([place])     PAST SURGICAL HISTORY: Past Surgical History:  Procedure Laterality Date  . COLONOSCOPY WITH PROPOFOL N/A 10/08/2016   Procedure: COLONOSCOPY WITH PROPOFOL;  Surgeon: Lollie Sails, MD;  [facility] ENDOSCOPY;  Service: Endoscopy;  Laterality: N/A;  . ESOPHAGOGASTRODUODENOSCOPY (EGD) WITH PROPOFOL N/A 10/08/2016   Procedure: ESOPHAGOGASTRODUODENOSCOPY (EGD) WITH PROPOFOL;  Surgeon: Lollie Sails, MD;  Location: [facility] ENDOSCOPY;  Service: Endoscopy;  Laterality: N/A;  . NASAL SEPTUM SURGERY    . TONSILLECTOMY      FAMILY HISTORY: Family History  Problem Relation Age of Onset  . Cancer Mother     ADVANCED DIRECTIVES (Y/N):  N  HEALTH MAINTENANCE: Social History   Tobacco Use  . Smoking status: Current Every Day Smoker    Packs/day: 1.00    Types: Cigarettes  . Smokeless tobacco: Never Used  Substance Use Topics  . Alcohol use: Yes    Alcohol/week: 8.0 standard drinks    Types: 8 Glasses of wine per week    Comment: 2 large bottles of wine daily  . Drug use: No     Colonoscopy:  PAP:  Bone density:  Lipid panel:  Allergies  Allergen Reactions  . Olanzapine Shortness Of Breath    numbness Pt stated her body shuts down   . Quetiapine Anaphylaxis  . Seroquel [Quetiapine Fumarate] Anaphylaxis    Mulitple psychiatric drug reactions, but unable  to list  . Celecoxib Other (See Comments)    GI bleed  . Citalopram Other (See Comments)    GI bleed  . Ferrous Gluconate   . Fluoxetine   . Ibuprofen   . Iron   . Meloxicam Other (See Comments)  . Imipramine Other (See Comments)    Gas pains    Current Outpatient Medications  Medication Sig Dispense Refill  . albuterol (PROVENTIL HFA;VENTOLIN HFA) 108 (90 Base) MCG/ACT inhaler Inhale 2 puffs into the lungs every 6 (six) hours as needed for  wheezing or shortness of breath. 1 Inhaler 1  . cetirizine (ZYRTEC) 10 MG tablet Take 1 tablet (10 mg total) by mouth daily. (Patient taking differently: Take 10 mg by mouth 2 (two) times daily. ) 30 tablet 2  . citalopram (CELEXA) 10 MG tablet Take by mouth.    . diazepam (VALIUM) 10 MG tablet Take 10 mg by mouth 3 (three) times daily. psych    . omeprazole (PRILOSEC) 20 MG capsule Take one capsule once daily. (Patient taking differently: Take 40 mg by mouth daily. Take one capsule once daily.) 21 capsule 0  . omeprazole (PRILOSEC) 40 MG capsule Take 40 mg by mouth daily.    Marland Kitchen tiotropium (SPIRIVA HANDIHALER) 18 MCG inhalation capsule Place 1 capsule (18 mcg total) into inhaler and inhale daily. 30 capsule 2  . tiZANidine (ZANAFLEX) 4 MG tablet     . valACYclovir (VALTREX) 1000 MG tablet Take 1,000 mg by mouth 2 (two) times daily.     No current facility-administered medications for this visit.     OBJECTIVE: Vitals:   09/05/18 1320  BP: 138/85  Pulse: 75  Temp: 97.8 F (36.6 C)     Body mass index is 32.88 kg/m.    ECOG FS:0 - Asymptomatic  General: Well-developed, well-nourished, no acute distress. Eyes: Pink conjunctiva, anicteric sclera. HEENT: Normocephalic, moist mucous membranes. Lungs: Clear to auscultation bilaterally. Heart: Regular rate and rhythm. No rubs, murmurs, or gallops. Abdomen: Soft, nontender, nondistended. No organomegaly noted, normoactive bowel sounds. Musculoskeletal: No edema, cyanosis, or clubbing. Neuro: Alert, answering all questions appropriately. Cranial nerves grossly intact. Skin: No rashes or petechiae noted. Psych: Normal affect.  LAB RESULTS:  Lab Results  Component Value Date   NA 140 08/03/2018   K 4.0 08/03/2018   CL 104 08/03/2018   CO2 25 08/03/2018   GLUCOSE 100 (H) 08/03/2018   BUN 8 08/03/2018   CREATININE 0.83 08/03/2018   CALCIUM 8.5 (L) 08/03/2018   PROT 6.3 (L) 08/03/2018   ALBUMIN 3.5 08/03/2018   AST 37 08/03/2018    ALT 43 08/03/2018   ALKPHOS 97 08/03/2018   BILITOT 0.2 (L) 08/03/2018   GFRNONAA >60 08/03/2018   GFRAA >60 08/03/2018    Lab Results  Component Value Date   WBC 8.4 09/05/2018   NEUTROABS 6.1 09/05/2018   HGB 13.8 09/05/2018   HCT 41.9 09/05/2018   MCV 108.3 (H) 09/05/2018   PLT 215 09/05/2018   Lab Results  Component Value Date   IRON 68 09/05/2018   TIBC 437 09/05/2018   IRONPCTSAT 16 09/05/2018   Lab Results  Component Value Date   FERRITIN 22 09/05/2018     STUDIES: No results found.  ASSESSMENT: Iron deficiency anemia.  PLAN:    1.  Iron deficiency anemia: Colonoscopy and EGD on October 08, 2016 revealed multiple colonic polyps, but no other abnormalities or obvious sites of bleeding.  Patient's hemoglobin and iron stores are within normal limits today.  Previously, the remainder of her laboratory work was either negative or within normal limits.  She does not require additional IV Feraheme today.  Return to clinic in 4 months for repeat laboratory work, further evaluation, and continuation of treatment.   2.  B12 deficiency: Likely dietary related.  Patient last received a B12 injection on May 17, 2017. 3.  History of suicidal ideation: Patient denies this today.    I spent a total of 30 minutes face-to-face with the patient of which greater than 50% of the visit was spent in counseling and coordination of care as detailed above.   Patient expressed understanding and was in agreement with this plan. She also understands that She can call clinic at any time with any questions, concerns, or complaints.    Lloyd Huger, MD   09/07/2018 6:30 AM

## 2018-09-05 ENCOUNTER — Encounter: Payer: Self-pay | Admitting: Oncology

## 2018-09-05 ENCOUNTER — Inpatient Hospital Stay: Payer: Medicare HMO | Attending: Oncology

## 2018-09-05 ENCOUNTER — Other Ambulatory Visit: Payer: Self-pay

## 2018-09-05 ENCOUNTER — Inpatient Hospital Stay: Payer: Medicare HMO

## 2018-09-05 ENCOUNTER — Inpatient Hospital Stay (HOSPITAL_BASED_OUTPATIENT_CLINIC_OR_DEPARTMENT_OTHER): Payer: Medicare HMO | Admitting: Oncology

## 2018-09-05 VITALS — BP 138/85 | HR 75 | Temp 97.8°F | Ht 66.0 in | Wt 203.7 lb

## 2018-09-05 DIAGNOSIS — E538 Deficiency of other specified B group vitamins: Secondary | ICD-10-CM | POA: Diagnosis not present

## 2018-09-05 DIAGNOSIS — F17210 Nicotine dependence, cigarettes, uncomplicated: Secondary | ICD-10-CM | POA: Diagnosis not present

## 2018-09-05 DIAGNOSIS — Z79899 Other long term (current) drug therapy: Secondary | ICD-10-CM | POA: Insufficient documentation

## 2018-09-05 DIAGNOSIS — K219 Gastro-esophageal reflux disease without esophagitis: Secondary | ICD-10-CM | POA: Diagnosis not present

## 2018-09-05 DIAGNOSIS — D509 Iron deficiency anemia, unspecified: Secondary | ICD-10-CM

## 2018-09-05 DIAGNOSIS — D649 Anemia, unspecified: Secondary | ICD-10-CM

## 2018-09-05 LAB — IRON AND TIBC
Iron: 68 ug/dL (ref 28–170)
Saturation Ratios: 16 % (ref 10.4–31.8)
TIBC: 437 ug/dL (ref 250–450)
UIBC: 369 ug/dL

## 2018-09-05 LAB — CBC WITH DIFFERENTIAL/PLATELET
Abs Immature Granulocytes: 0.03 10*3/uL (ref 0.00–0.07)
Basophils Absolute: 0.1 10*3/uL (ref 0.0–0.1)
Basophils Relative: 1 %
Eosinophils Absolute: 0.1 10*3/uL (ref 0.0–0.5)
Eosinophils Relative: 1 %
HCT: 41.9 % (ref 36.0–46.0)
Hemoglobin: 13.8 g/dL (ref 12.0–15.0)
Immature Granulocytes: 0 %
Lymphocytes Relative: 21 %
Lymphs Abs: 1.7 10*3/uL (ref 0.7–4.0)
MCH: 35.7 pg — ABNORMAL HIGH (ref 26.0–34.0)
MCHC: 32.9 g/dL (ref 30.0–36.0)
MCV: 108.3 fL — ABNORMAL HIGH (ref 80.0–100.0)
Monocytes Absolute: 0.4 10*3/uL (ref 0.1–1.0)
Monocytes Relative: 5 %
Neutro Abs: 6.1 10*3/uL (ref 1.7–7.7)
Neutrophils Relative %: 72 %
Platelets: 215 10*3/uL (ref 150–400)
RBC: 3.87 MIL/uL (ref 3.87–5.11)
RDW: 13.7 % (ref 11.5–15.5)
WBC: 8.4 10*3/uL (ref 4.0–10.5)
nRBC: 0 % (ref 0.0–0.2)

## 2018-09-05 LAB — FERRITIN: Ferritin: 22 ng/mL (ref 11–307)

## 2018-09-05 NOTE — Progress Notes (Signed)
Patient stated that she had not been able to have her colonoscopy done because she does not have anybody to go with her.

## 2018-09-05 NOTE — Progress Notes (Signed)
Patient reported that she will run out of money to pay for her hotel on Thursday.  She receives a Chief of Staff Check each month and she does not have any funds until her next check in August.  PSN providing patient with gas vouchers for her car and groceries.  PSN gave her information about government housing, women's shelters and homeless shelters.  All have waiting lists.  [facility] will pay for patient to stay in a hotel for at least a week until she can make other arrangements.

## 2019-01-09 ENCOUNTER — Inpatient Hospital Stay: Payer: Medicare HMO | Attending: Oncology

## 2019-01-09 ENCOUNTER — Other Ambulatory Visit: Payer: Self-pay

## 2019-01-09 DIAGNOSIS — D649 Anemia, unspecified: Secondary | ICD-10-CM | POA: Diagnosis present

## 2019-01-09 LAB — CBC WITH DIFFERENTIAL/PLATELET
Abs Immature Granulocytes: 0.02 10*3/uL (ref 0.00–0.07)
Basophils Absolute: 0 10*3/uL (ref 0.0–0.1)
Basophils Relative: 1 %
Eosinophils Absolute: 0.1 10*3/uL (ref 0.0–0.5)
Eosinophils Relative: 1 %
HCT: 43.8 % (ref 36.0–46.0)
Hemoglobin: 14.1 g/dL (ref 12.0–15.0)
Immature Granulocytes: 0 %
Lymphocytes Relative: 28 %
Lymphs Abs: 2.2 10*3/uL (ref 0.7–4.0)
MCH: 31.8 pg (ref 26.0–34.0)
MCHC: 32.2 g/dL (ref 30.0–36.0)
MCV: 98.9 fL (ref 80.0–100.0)
Monocytes Absolute: 0.4 10*3/uL (ref 0.1–1.0)
Monocytes Relative: 5 %
Neutro Abs: 5.2 10*3/uL (ref 1.7–7.7)
Neutrophils Relative %: 65 %
Platelets: 181 10*3/uL (ref 150–400)
RBC: 4.43 MIL/uL (ref 3.87–5.11)
RDW: 17.3 % — ABNORMAL HIGH (ref 11.5–15.5)
WBC: 8 10*3/uL (ref 4.0–10.5)
nRBC: 0 % (ref 0.0–0.2)

## 2019-01-09 LAB — IRON AND TIBC
Iron: 154 ug/dL (ref 28–170)
Saturation Ratios: 29 % (ref 10.4–31.8)
TIBC: 536 ug/dL — ABNORMAL HIGH (ref 250–450)
UIBC: 382 ug/dL

## 2019-01-09 LAB — FERRITIN: Ferritin: 19 ng/mL (ref 11–307)

## 2019-01-16 ENCOUNTER — Other Ambulatory Visit: Payer: Medicare HMO

## 2019-01-16 ENCOUNTER — Ambulatory Visit: Payer: Medicare HMO

## 2019-01-16 ENCOUNTER — Ambulatory Visit: Payer: Medicare HMO | Admitting: Oncology

## 2019-03-23 ENCOUNTER — Other Ambulatory Visit: Payer: Self-pay | Admitting: Emergency Medicine

## 2019-03-23 DIAGNOSIS — D509 Iron deficiency anemia, unspecified: Secondary | ICD-10-CM

## 2019-03-23 NOTE — Progress Notes (Deleted)
[place]  Telephone:([phone]) [phone] Fax:(336) [phone]  ID: [name] OB: 1960-12-05  MR#: [id number]  [id number]  Patient Care Team: [facility] as PCP - General  CHIEF COMPLAINT: Iron deficiency anemia.  INTERVAL HISTORY: Patient returns to clinic today for repeat laboratory work, further evaluation, and consideration of additional IV Feraheme.  She currently feels well and is asymptomatic.  She does not complain of weakness or fatigue.  She has no neurologic complaints. She denies any recent fevers or illnesses.  She denies any chest pain, shortness of breath, cough, or hemoptysis.  She denies any nausea, vomiting, constipation, or diarrhea.  She has no melena or hematochezia.  She has no urinary complaints.  Patient offers no specific complaints today.  REVIEW OF SYSTEMS:   Review of Systems  Constitutional: Negative.  Negative for fever, malaise/fatigue and weight loss.  Respiratory: Negative.  Negative for cough, hemoptysis and shortness of breath.   Cardiovascular: Negative.  Negative for chest pain and leg swelling.  Gastrointestinal: Negative.  Negative for abdominal pain, blood in stool and melena.  Genitourinary: Negative.  Negative for hematuria.  Musculoskeletal: Negative.  Negative for back pain.  Skin: Negative.  Negative for rash.  Neurological: Negative.  Negative for sensory change, focal weakness and weakness.  Psychiatric/Behavioral: Negative.  Negative for depression, substance abuse and suicidal ideas. The patient is not nervous/anxious.     As per HPI. Otherwise, a complete review of systems is negative.  PAST MEDICAL HISTORY: Past Medical History:  Diagnosis Date  . Acid reflux   . Allergy   . Anemia   . Anxiety   . Asthma   . Collagen vascular disease ([place])   . COPD (chronic obstructive pulmonary disease) ([place])   . Depression   . Diarrhea   . Emphysema of lung ([place])   . Frequent headaches   . Head injury   .  Osteoarthritis   . Paranoid disorder ([place])   . Pre-diabetes   . Psychiatric disorder   . Seasonal allergic rhinitis   . Sleep apnea   . Suicidal overdose ([place])     PAST SURGICAL HISTORY: Past Surgical History:  Procedure Laterality Date  . COLONOSCOPY WITH PROPOFOL N/A 10/08/2016   Procedure: COLONOSCOPY WITH PROPOFOL;  Surgeon: Lollie Sails, MD;  Location: [facility] ENDOSCOPY;  Service: Endoscopy;  Laterality: N/A;  . ESOPHAGOGASTRODUODENOSCOPY (EGD) WITH PROPOFOL N/A 10/08/2016   Procedure: ESOPHAGOGASTRODUODENOSCOPY (EGD) WITH PROPOFOL;  Surgeon: Lollie Sails, MD;  Location: [facility] ENDOSCOPY;  Service: Endoscopy;  Laterality: N/A;  . NASAL SEPTUM SURGERY    . TONSILLECTOMY      FAMILY HISTORY: Family History  Problem Relation Age of Onset  . Cancer Mother     ADVANCED DIRECTIVES (Y/N):  N  HEALTH MAINTENANCE: Social History   Tobacco Use  . Smoking status: Current Every Day Smoker    Packs/day: 1.00    Types: Cigarettes  . Smokeless tobacco: Never Used  Substance Use Topics  . Alcohol use: Yes    Alcohol/week: 8.0 standard drinks    Types: 8 Glasses of wine per week    Comment: 2 large bottles of wine daily  . Drug use: No     Colonoscopy:  PAP:  Bone density:  Lipid panel:  Allergies  Allergen Reactions  . Olanzapine Shortness Of Breath    numbness Pt stated her body shuts down   . Quetiapine Anaphylaxis  . Seroquel [Quetiapine Fumarate] Anaphylaxis    Mulitple psychiatric drug reactions, but unable  to list  . Celecoxib Other (See Comments)    GI bleed  . Citalopram Other (See Comments)    GI bleed  . Ferrous Gluconate   . Fluoxetine   . Ibuprofen   . Iron   . Meloxicam Other (See Comments)  . Imipramine Other (See Comments)    Gas pains    Current Outpatient Medications  Medication Sig Dispense Refill  . albuterol (PROVENTIL HFA;VENTOLIN HFA) 108 (90 Base) MCG/ACT inhaler Inhale 2 puffs into the lungs every 6 (six) hours as needed for  wheezing or shortness of breath. 1 Inhaler 1  . cetirizine (ZYRTEC) 10 MG tablet Take 1 tablet (10 mg total) by mouth daily. (Patient taking differently: Take 10 mg by mouth 2 (two) times daily. ) 30 tablet 2  . citalopram (CELEXA) 10 MG tablet Take by mouth.    . diazepam (VALIUM) 10 MG tablet Take 10 mg by mouth 3 (three) times daily. psych    . omeprazole (PRILOSEC) 20 MG capsule Take one capsule once daily. (Patient taking differently: Take 40 mg by mouth daily. Take one capsule once daily.) 21 capsule 0  . omeprazole (PRILOSEC) 40 MG capsule Take 40 mg by mouth daily.    Marland Kitchen tiotropium (SPIRIVA HANDIHALER) 18 MCG inhalation capsule Place 1 capsule (18 mcg total) into inhaler and inhale daily. 30 capsule 2  . tiZANidine (ZANAFLEX) 4 MG tablet     . valACYclovir (VALTREX) 1000 MG tablet Take 1,000 mg by mouth 2 (two) times daily.     No current facility-administered medications for this visit.    OBJECTIVE: There were no vitals filed for this visit.   There is no height or weight on file to calculate BMI.    ECOG FS:0 - Asymptomatic  General: Well-developed, well-nourished, no acute distress. Eyes: Pink conjunctiva, anicteric sclera. HEENT: Normocephalic, moist mucous membranes. Lungs: Clear to auscultation bilaterally. Heart: Regular rate and rhythm. No rubs, murmurs, or gallops. Abdomen: Soft, nontender, nondistended. No organomegaly noted, normoactive bowel sounds. Musculoskeletal: No edema, cyanosis, or clubbing. Neuro: Alert, answering all questions appropriately. Cranial nerves grossly intact. Skin: No rashes or petechiae noted. Psych: Normal affect.  LAB RESULTS:  Lab Results  Component Value Date   NA 140 08/03/2018   K 4.0 08/03/2018   CL 104 08/03/2018   CO2 25 08/03/2018   GLUCOSE 100 (H) 08/03/2018   BUN 8 08/03/2018   CREATININE 0.83 08/03/2018   CALCIUM 8.5 (L) 08/03/2018   PROT 6.3 (L) 08/03/2018   ALBUMIN 3.5 08/03/2018   AST 37 08/03/2018   ALT 43  08/03/2018   ALKPHOS 97 08/03/2018   BILITOT 0.2 (L) 08/03/2018   GFRNONAA >60 08/03/2018   GFRAA >60 08/03/2018    Lab Results  Component Value Date   WBC 8.0 01/09/2019   NEUTROABS 5.2 01/09/2019   HGB 14.1 01/09/2019   HCT 43.8 01/09/2019   MCV 98.9 01/09/2019   PLT 181 01/09/2019   Lab Results  Component Value Date   IRON 154 01/09/2019   TIBC 536 (H) 01/09/2019   IRONPCTSAT 29 01/09/2019   Lab Results  Component Value Date   FERRITIN 19 01/09/2019     STUDIES: No results found.  ASSESSMENT: Iron deficiency anemia.  PLAN:    1.  Iron deficiency anemia: Colonoscopy and EGD on October 08, 2016 revealed multiple colonic polyps, but no other abnormalities or obvious sites of bleeding.  Patient's hemoglobin and iron stores are within normal limits today.  Previously, the remainder of her laboratory  work was either negative or within normal limits.  She does not require additional IV Feraheme today.  Return to clinic in 4 months for repeat laboratory work, further evaluation, and continuation of treatment.   2.  B12 deficiency: Likely dietary related.  Patient last received a B12 injection on May 17, 2017. 3.  History of suicidal ideation: Patient denies this today.    I spent a total of 30 minutes face-to-face with the patient of which greater than 50% of the visit was spent in counseling and coordination of care as detailed above.   Patient expressed understanding and was in agreement with this plan. She also understands that She can call clinic at any time with any questions, concerns, or complaints.    Lloyd Huger, MD   03/23/2019 6:05 AM

## 2019-03-26 ENCOUNTER — Inpatient Hospital Stay: Payer: Medicare PPO

## 2019-03-26 ENCOUNTER — Inpatient Hospital Stay: Payer: Medicare PPO | Admitting: Oncology

## 2020-03-12 IMAGING — CR DG RIBS W/ CHEST 3+V*L*
5 series · 5 of 5 positions shown · non-contrast
Comparison: CT of the abdomen and pelvis 08/26/2017

CLINICAL DATA: Fall last evening.  Left rib pain.

EXAM:
LEFT RIBS AND CHEST - 3+ VIEW

[chest pa]
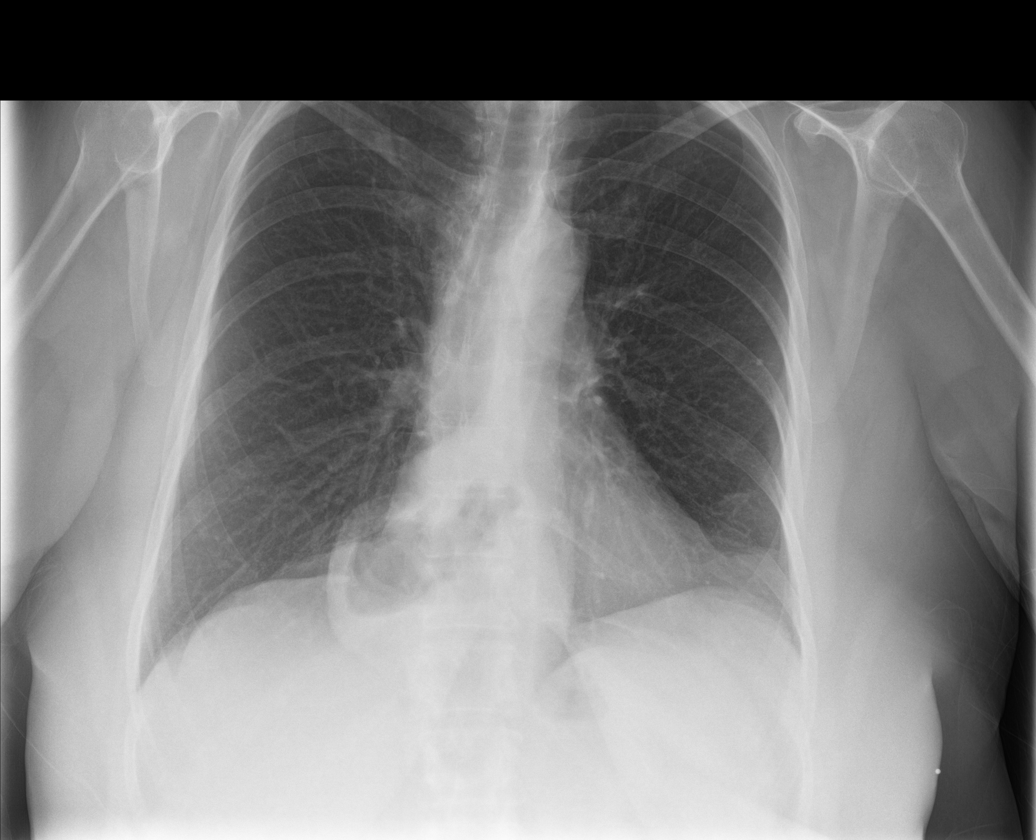

[rib pa (1 of 2)]
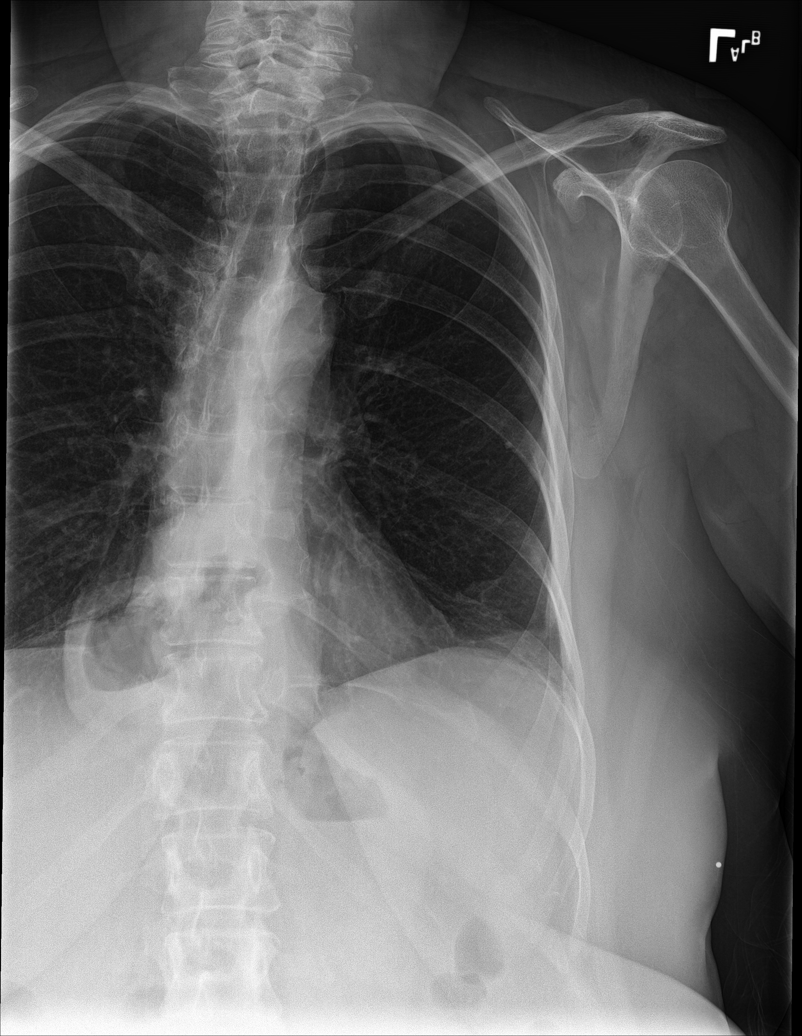

[rib pa (2 of 2)]
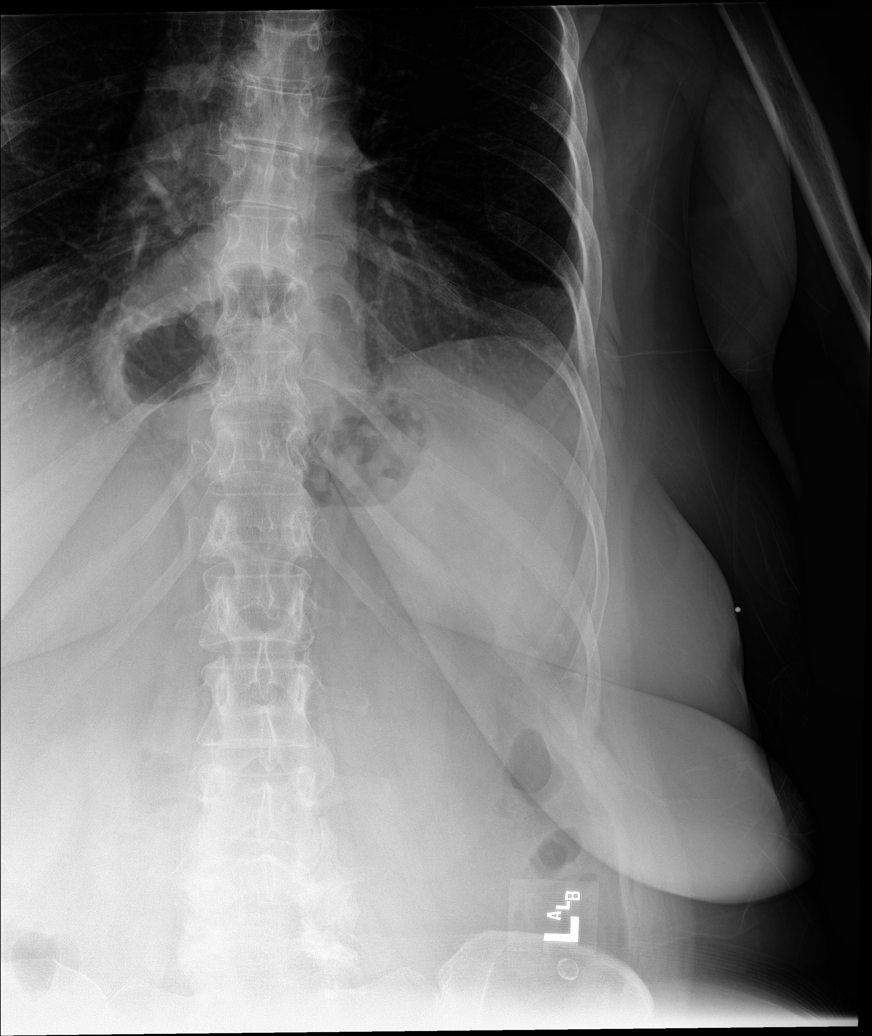

[rib obl (1 of 2)]
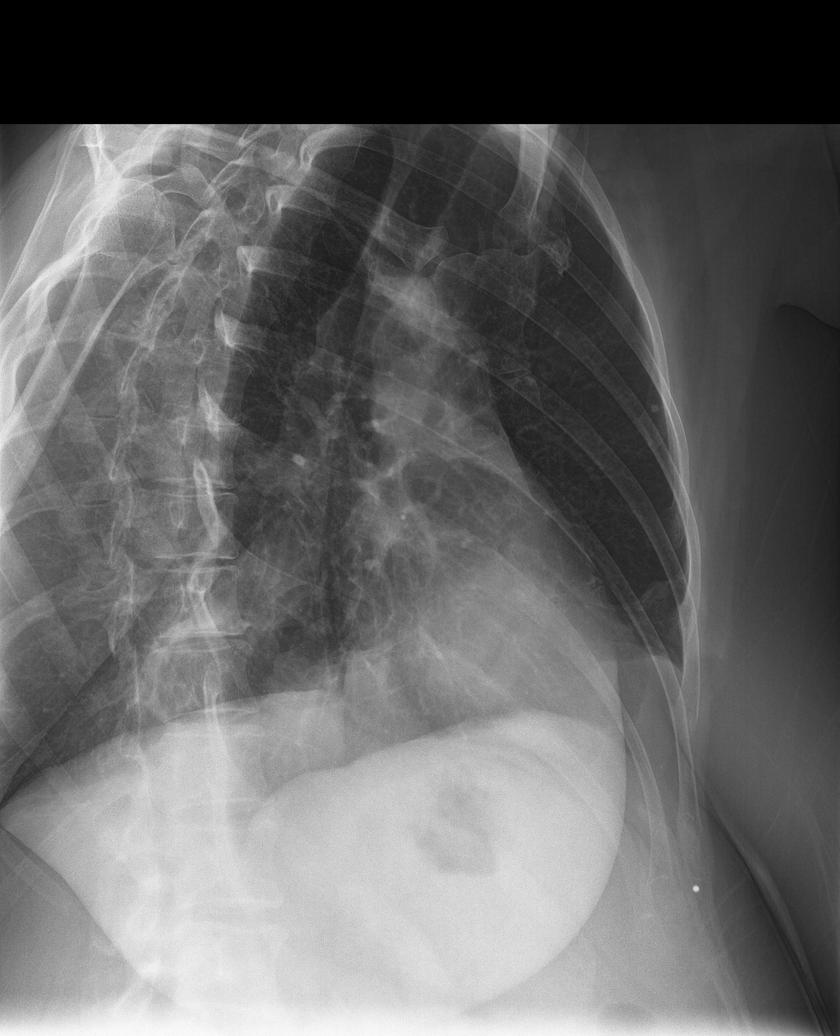

[rib obl (2 of 2)]
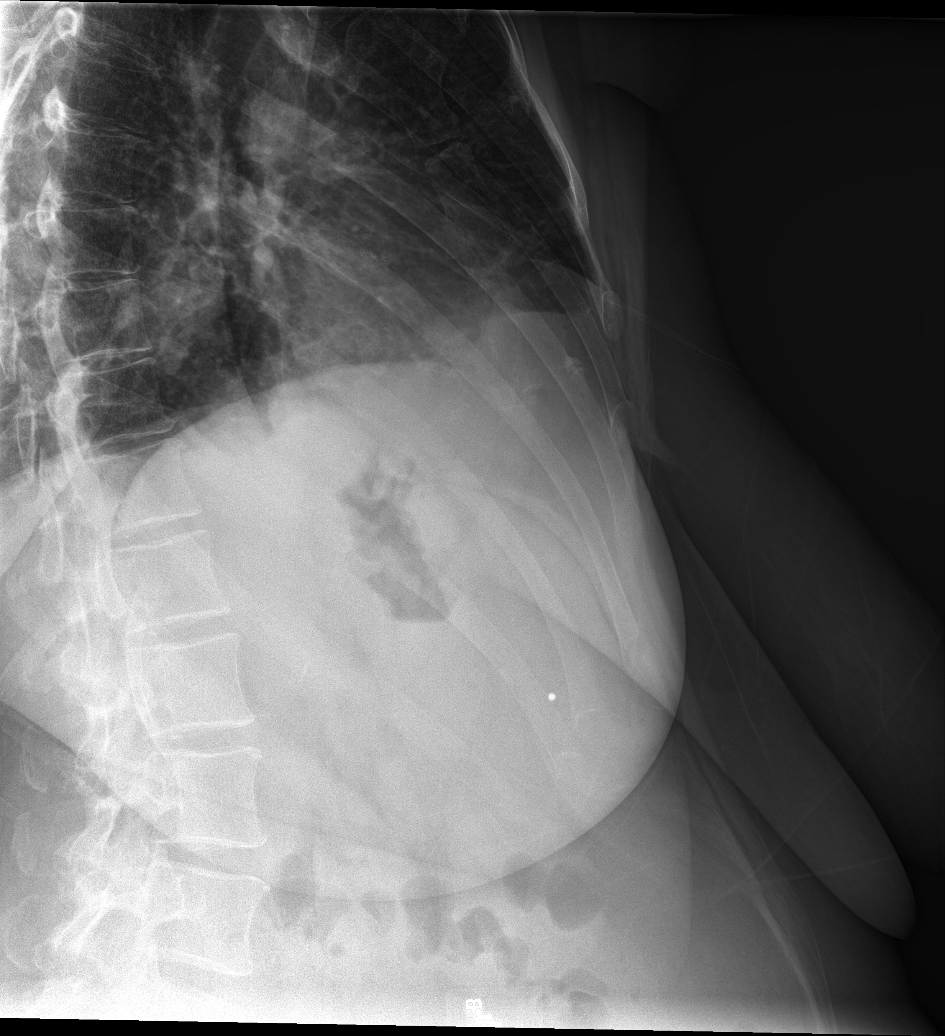

[5 of 5 positions shown; findings below may reference images not displayed]

FINDINGS: Heart size is normal. A moderate-sized hiatal hernia is again seen.
The lungs are clear. There is no edema or effusion. No focal
airspace disease is present.

Dedicated imaging ribs demonstrates no acute or healing fractures
IMPRESSION: 1. No acute or healing trauma.
2. Moderate size cardiomegaly.
3. No acute cardiopulmonary disease.
